# Patient Record
Sex: Female | Born: 1990 | Race: Black or African American | Hispanic: No | Marital: Single | Smoking: Current some day smoker
Health system: Southern US, Community
[De-identification: ages and names within clinical notes are randomized; demographics above are authoritative.]

## PROBLEM LIST (undated history)

## (undated) ENCOUNTER — Inpatient Hospital Stay (HOSPITAL_COMMUNITY): Payer: Self-pay

## (undated) ENCOUNTER — Emergency Department (HOSPITAL_COMMUNITY): Admission: EM | Payer: Medicaid Other | Source: Home / Self Care

## (undated) DIAGNOSIS — O30049 Twin pregnancy, dichorionic/diamniotic, unspecified trimester: Secondary | ICD-10-CM

## (undated) DIAGNOSIS — Z789 Other specified health status: Secondary | ICD-10-CM

## (undated) DIAGNOSIS — Z3480 Encounter for supervision of other normal pregnancy, unspecified trimester: Secondary | ICD-10-CM

## (undated) HISTORY — DX: Twin pregnancy, dichorionic/diamniotic, unspecified trimester: O30.049

## (undated) HISTORY — DX: Encounter for supervision of other normal pregnancy, unspecified trimester: Z34.80

## (undated) HISTORY — PX: INDUCED ABORTION: SHX677

---
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
## 2010-03-21 ENCOUNTER — Inpatient Hospital Stay (HOSPITAL_COMMUNITY)
Admission: AD | Admit: 2010-03-21 | Discharge: 2010-03-21 | Disposition: A | Discharge status: Home / Self Care | Payer: Federal, State, Local not specified - PPO | Source: Ambulatory Visit | Attending: Obstetrics | Admitting: Obstetrics

## 2010-03-21 DIAGNOSIS — N898 Other specified noninflammatory disorders of vagina: Secondary | ICD-10-CM | POA: Insufficient documentation

## 2010-05-09 LAB — CBC
HCT: 29 % — ABNORMAL LOW (ref 36.0–46.0)
HCT: 31.9 % — ABNORMAL LOW (ref 36.0–46.0)
Hemoglobin: 10.5 g/dL — ABNORMAL LOW (ref 12.0–15.0)
MCHC: 32.8 g/dL (ref 30.0–36.0)
Platelets: 358 10*3/uL (ref 150–400)
Platelets: 372 10*3/uL (ref 150–400)
RBC: 3.46 MIL/uL — ABNORMAL LOW (ref 3.87–5.11)
RBC: 3.81 MIL/uL — ABNORMAL LOW (ref 3.87–5.11)
WBC: 20.6 10*3/uL — ABNORMAL HIGH (ref 4.0–10.5)

## 2010-05-09 LAB — CCBB MATERNAL DONOR DRAW

## 2010-05-09 LAB — RPR: RPR Ser Ql: NONREACTIVE

## 2010-05-17 LAB — CBC
HCT: 31 % — ABNORMAL LOW (ref 36.0–46.0)
Hemoglobin: 10.7 g/dL — ABNORMAL LOW (ref 12.0–15.0)
MCHC: 34.4 g/dL (ref 30.0–36.0)
MCV: 90.7 fL (ref 78.0–100.0)
RBC: 3.42 MIL/uL — ABNORMAL LOW (ref 3.87–5.11)
RDW: 13.7 % (ref 11.5–15.5)
WBC: 14.2 10*3/uL — ABNORMAL HIGH (ref 4.0–10.5)

## 2010-05-19 LAB — WET PREP, GENITAL
Clue Cells Wet Prep HPF POC: NONE SEEN
Trich, Wet Prep: NONE SEEN

## 2010-05-19 LAB — GC/CHLAMYDIA PROBE AMP, GENITAL
Chlamydia, DNA Probe: NEGATIVE
GC Probe Amp, Genital: NEGATIVE

## 2010-05-19 LAB — URINE MICROSCOPIC-ADD ON

## 2010-05-19 LAB — URINALYSIS, ROUTINE W REFLEX MICROSCOPIC
Bilirubin Urine: NEGATIVE
Glucose, UA: NEGATIVE mg/dL
Ketones, ur: NEGATIVE mg/dL
Protein, ur: NEGATIVE mg/dL
Urobilinogen, UA: 1 mg/dL (ref 0.0–1.0)

## 2010-05-21 LAB — URINALYSIS, ROUTINE W REFLEX MICROSCOPIC
Ketones, ur: 15 mg/dL — AB
Leukocytes, UA: NEGATIVE
Protein, ur: NEGATIVE mg/dL
Specific Gravity, Urine: >1.03 — ABNORMAL HIGH (ref 1.005–1.030)
Urobilinogen, UA: 1 mg/dL (ref 0.0–1.0)

## 2010-05-21 LAB — WET PREP, GENITAL: Trich, Wet Prep: NONE SEEN

## 2010-05-22 LAB — URINALYSIS, ROUTINE W REFLEX MICROSCOPIC
Bilirubin Urine: NEGATIVE
Glucose, UA: NEGATIVE mg/dL
Glucose, UA: 100 mg/dL — AB
Hgb urine dipstick: NEGATIVE
Ketones, ur: NEGATIVE mg/dL
Nitrite: NEGATIVE
Nitrite: NEGATIVE
Protein, ur: NEGATIVE mg/dL
Specific Gravity, Urine: 1.008 (ref 1.005–1.030)
Urobilinogen, UA: 0.2 mg/dL (ref 0.0–1.0)
pH: 5.5 (ref 5.0–8.0)

## 2010-05-22 LAB — CBC
HCT: 37 % (ref 36.0–46.0)
MCHC: 34.7 g/dL (ref 30.0–36.0)
RBC: 4.07 MIL/uL (ref 3.87–5.11)
WBC: 14 10*3/uL — ABNORMAL HIGH (ref 4.0–10.5)

## 2010-05-22 LAB — GC/CHLAMYDIA PROBE AMP, GENITAL
Chlamydia, DNA Probe: NEGATIVE
GC Probe Amp, Genital: NEGATIVE

## 2010-05-22 LAB — ABO/RH: ABO/RH(D): O POS

## 2010-05-22 LAB — URINE CULTURE

## 2010-05-22 LAB — WET PREP, GENITAL: Yeast Wet Prep HPF POC: NONE SEEN

## 2010-05-22 LAB — HCG, QUANTITATIVE, PREGNANCY: hCG, Beta Chain, Quant, S: 188082 m[IU]/mL — ABNORMAL HIGH (ref <5)

## 2010-05-22 LAB — PREGNANCY, URINE: Preg Test, Ur: POSITIVE

## 2010-05-23 LAB — URINALYSIS, ROUTINE W REFLEX MICROSCOPIC
Glucose, UA: NEGATIVE mg/dL
Ketones, ur: NEGATIVE mg/dL
pH: 6.5 (ref 5.0–8.0)

## 2010-05-23 LAB — WET PREP, GENITAL
Trich, Wet Prep: NONE SEEN
WBC, Wet Prep HPF POC: NONE SEEN
Yeast Wet Prep HPF POC: NONE SEEN

## 2010-05-23 LAB — GC/CHLAMYDIA PROBE AMP, GENITAL
Chlamydia, DNA Probe: NEGATIVE
GC Probe Amp, Genital: NEGATIVE

## 2010-05-23 LAB — URINE CULTURE

## 2010-06-17 ENCOUNTER — Emergency Department (HOSPITAL_COMMUNITY)
Admission: EM | Admit: 2010-06-17 | Discharge: 2010-06-17 | Disposition: A | Discharge status: Home / Self Care | Payer: Federal, State, Local not specified - PPO | Attending: Emergency Medicine | Admitting: Emergency Medicine

## 2010-06-17 ENCOUNTER — Emergency Department (HOSPITAL_COMMUNITY): Payer: Federal, State, Local not specified - PPO

## 2010-06-17 ENCOUNTER — Emergency Department: Admission: EM | Admit: 2010-06-17 | Payer: Federal, State, Local not specified - PPO | Source: Home / Self Care

## 2010-06-17 DIAGNOSIS — S93409A Sprain of unspecified ligament of unspecified ankle, initial encounter: Secondary | ICD-10-CM | POA: Insufficient documentation

## 2010-06-17 DIAGNOSIS — M25579 Pain in unspecified ankle and joints of unspecified foot: Secondary | ICD-10-CM | POA: Insufficient documentation

## 2010-06-17 DIAGNOSIS — X500XXA Overexertion from strenuous movement or load, initial encounter: Secondary | ICD-10-CM | POA: Insufficient documentation

## 2010-06-17 DIAGNOSIS — Y92009 Unspecified place in unspecified non-institutional (private) residence as the place of occurrence of the external cause: Secondary | ICD-10-CM | POA: Insufficient documentation

## 2010-07-09 ENCOUNTER — Emergency Department (HOSPITAL_COMMUNITY)
Admission: EM | Admit: 2010-07-09 | Discharge: 2010-07-10 | Discharge status: Against Medical Advice | Payer: Self-pay | Attending: Emergency Medicine | Admitting: Emergency Medicine

## 2010-07-09 DIAGNOSIS — R1012 Left upper quadrant pain: Secondary | ICD-10-CM | POA: Insufficient documentation

## 2010-07-09 DIAGNOSIS — S31109A Unspecified open wound of abdominal wall, unspecified quadrant without penetration into peritoneal cavity, initial encounter: Secondary | ICD-10-CM | POA: Insufficient documentation

## 2010-07-10 ENCOUNTER — Emergency Department (HOSPITAL_COMMUNITY)
Admission: EM | Admit: 2010-07-10 | Discharge: 2010-07-10 | Disposition: A | Discharge status: Home / Self Care | Payer: Self-pay | Attending: Emergency Medicine | Admitting: Emergency Medicine

## 2010-07-10 DIAGNOSIS — S31109A Unspecified open wound of abdominal wall, unspecified quadrant without penetration into peritoneal cavity, initial encounter: Secondary | ICD-10-CM | POA: Insufficient documentation

## 2010-07-10 DIAGNOSIS — Z23 Encounter for immunization: Secondary | ICD-10-CM | POA: Insufficient documentation

## 2010-11-18 ENCOUNTER — Emergency Department (HOSPITAL_COMMUNITY)
Admission: EM | Admit: 2010-11-18 | Discharge: 2010-11-18 | Disposition: A | Discharge status: Home / Self Care | Payer: Self-pay | Attending: Emergency Medicine | Admitting: Emergency Medicine

## 2010-11-18 DIAGNOSIS — K089 Disorder of teeth and supporting structures, unspecified: Secondary | ICD-10-CM | POA: Insufficient documentation

## 2010-11-18 DIAGNOSIS — R51 Headache: Secondary | ICD-10-CM | POA: Insufficient documentation

## 2010-11-23 ENCOUNTER — Emergency Department (HOSPITAL_COMMUNITY)
Admission: EM | Admit: 2010-11-23 | Discharge: 2010-11-23 | Disposition: A | Discharge status: Home / Self Care | Payer: Self-pay | Attending: Emergency Medicine | Admitting: Emergency Medicine

## 2010-11-23 DIAGNOSIS — R6884 Jaw pain: Secondary | ICD-10-CM | POA: Insufficient documentation

## 2010-11-23 DIAGNOSIS — R070 Pain in throat: Secondary | ICD-10-CM | POA: Insufficient documentation

## 2010-11-23 DIAGNOSIS — M26609 Unspecified temporomandibular joint disorder, unspecified side: Secondary | ICD-10-CM | POA: Insufficient documentation

## 2010-11-23 DIAGNOSIS — R1110 Vomiting, unspecified: Secondary | ICD-10-CM | POA: Insufficient documentation

## 2010-11-23 DIAGNOSIS — R209 Unspecified disturbances of skin sensation: Secondary | ICD-10-CM | POA: Insufficient documentation

## 2010-12-24 ENCOUNTER — Emergency Department (HOSPITAL_COMMUNITY): Payer: Self-pay

## 2010-12-24 ENCOUNTER — Encounter: Payer: Self-pay | Admitting: *Deleted

## 2010-12-24 ENCOUNTER — Emergency Department (HOSPITAL_COMMUNITY)
Admission: EM | Admit: 2010-12-24 | Discharge: 2010-12-24 | Disposition: A | Discharge status: Home / Self Care | Payer: Self-pay | Attending: Emergency Medicine | Admitting: Emergency Medicine

## 2010-12-24 DIAGNOSIS — IMO0002 Reserved for concepts with insufficient information to code with codable children: Secondary | ICD-10-CM | POA: Insufficient documentation

## 2010-12-24 DIAGNOSIS — M25569 Pain in unspecified knee: Secondary | ICD-10-CM | POA: Insufficient documentation

## 2010-12-24 DIAGNOSIS — S80212A Abrasion, left knee, initial encounter: Secondary | ICD-10-CM

## 2010-12-24 DIAGNOSIS — W010XXA Fall on same level from slipping, tripping and stumbling without subsequent striking against object, initial encounter: Secondary | ICD-10-CM | POA: Insufficient documentation

## 2010-12-24 MED ORDER — IBUPROFEN 800 MG PO TABS: 800.0000 mg | ORAL_TABLET | Freq: Three times a day (TID) | ORAL | Status: AC

## 2010-12-24 NOTE — ED Provider Notes (Signed)
History     CSN: [id number] Arrival date & time: 12/24/2010  7:34 AM   First MD Initiated Contact with Patient 12/24/10 0827      Chief Complaint  Patient presents with  . Fall    (Consider location/radiation/quality/duration/timing/severity/associated sxs/prior treatment) Patient is a 20 y.o. female presenting with knee pain.  Knee Pain This is a new problem. The current episode started today. The problem occurs constantly. The problem has been unchanged. Associated symptoms include arthralgias. Pertinent negatives include no joint swelling, myalgias, nausea, numbness, rash or weakness. The symptoms are aggravated by walking and bending. She has tried nothing for the symptoms.   She was leaving work this morning around 4 AM, when she tripped and fell on some concrete. She suffered a abrasion to her left knee. The knee is painful at this time. She is able to flex and extend it, and can bear weight. However, walking makes the pain worse. She has not tried anything for this at home.  History reviewed. No pertinent past medical history.  History reviewed. No pertinent past surgical history.  No family history on file.  History  Substance Use Topics  . Smoking status: Never Smoker   . Smokeless tobacco: Not on file  . Alcohol Use: No    OB History    Grav Para Term Preterm Abortions TAB SAB Ect Mult Living                  Review of Systems  Constitutional: Negative.   Gastrointestinal: Negative for nausea.  Musculoskeletal: Positive for arthralgias. Negative for myalgias, joint swelling and gait problem.  Skin: Positive for wound. Negative for color change and rash.  Neurological: Negative for weakness and numbness.  Hematological: Does not bruise/bleed easily.    Allergies  Review of patient's allergies indicates no known allergies.  Home Medications  No current outpatient prescriptions on file.  BP 114/70  Pulse 83  Temp(Src) 97.6 F (36.4 C) (Oral)  Resp 18   SpO2 100%  LMP 12/04/2010  Physical Exam  Nursing note and vitals reviewed. Constitutional: She is oriented to person, place, and time. She appears well-developed and well-nourished. No distress.  HENT:  Head: Normocephalic and atraumatic.  Right Ear: External ear normal.  Left Ear: External ear normal.  Eyes: Conjunctivae are normal. Pupils are equal, round, and reactive to light.  Neck: Normal range of motion.  Musculoskeletal: Normal range of motion. She exhibits tenderness.       Left knee: There is slight tenderness to palpation over the patella. No edema or erythema noted. Drawer testing and medial and lateral stress testing negative. McMurray's negative. Full range of motion.  Neurological: She is alert and oriented to person, place, and time. She has normal reflexes. No cranial nerve deficit.  Skin: Skin is warm and dry. She is not diaphoretic.       1 cm abrasion noted to the anterior left knee over patella. It is not bleeding.  Psychiatric: She has a normal mood and affect.    ED Course  Procedures (including critical care time)  Labs Reviewed - No data to display Dg Knee Complete 4 Views Left  12/24/2010  *RADIOLOGY REPORT*  Clinical Data: Anterior left knee pain/abrasions  LEFT KNEE - COMPLETE 4+ VIEW  Comparison: None.  Findings:  No fracture or dislocation.  Joint spaces are preserved.  No joint effusion.  No evidence of chondrocalcinosis.  Regional soft tissues are normal.  No radiopaque foreign body.  IMPRESSION:  No  fracture.  No radiopaque foreign body.  Original Report Authenticated By: Waynard Reeds, M.D.     1. Abrasion of left knee       MDM  Plain films reviewed. Knee exam unremarkable for acute pathology. She was instructed to practice RICE for the next few days and use ibuprofen. She verbalized understanding and agreed to plan.        Grant Fontana, [place] 12/24/10 [phone]

## 2010-12-24 NOTE — ED Provider Notes (Signed)
Medical screening examination/treatment/procedure(s) were performed by non-physician practitioner and as supervising physician I was immediately available for consultation/collaboration.  Juliet Rude. Rubin Payor, MD 12/24/10 1649

## 2010-12-24 NOTE — ED Notes (Signed)
Pt is A/O x4. Skin warm and dry. Respirations even and unlabored. NAD noted at this time.   

## 2010-12-24 NOTE — ED Notes (Signed)
Pt states she fell this am around 4 on concrete, has small abrasion/knot on left knee, able to walk on ext

## 2011-07-19 ENCOUNTER — Encounter (HOSPITAL_COMMUNITY): Payer: Self-pay | Admitting: *Deleted

## 2011-07-19 ENCOUNTER — Inpatient Hospital Stay (HOSPITAL_COMMUNITY)
Admission: AD | Admit: 2011-07-19 | Discharge: 2011-07-19 | Disposition: A | Discharge status: Home / Self Care | Payer: Self-pay | Source: Ambulatory Visit | Attending: Obstetrics & Gynecology | Admitting: Obstetrics & Gynecology

## 2011-07-19 DIAGNOSIS — N920 Excessive and frequent menstruation with regular cycle: Secondary | ICD-10-CM

## 2011-07-19 DIAGNOSIS — IMO0002 Reserved for concepts with insufficient information to code with codable children: Secondary | ICD-10-CM | POA: Insufficient documentation

## 2011-07-19 LAB — CBC
MCH: 30.1 pg (ref 26.0–34.0)
MCV: 89.1 fL (ref 78.0–100.0)
Platelets: 342 10*3/uL (ref 150–400)
RDW: 13.8 % (ref 11.5–15.5)
WBC: 13.5 10*3/uL — ABNORMAL HIGH (ref 4.0–10.5)

## 2011-07-19 MED ORDER — DOXYCYCLINE HYCLATE 100 MG PO CAPS: 100.0000 mg | ORAL_CAPSULE | Freq: Two times a day (BID) | ORAL | Status: AC

## 2011-07-19 MED ORDER — METHYLERGONOVINE MALEATE 0.2 MG PO TABS
0.2000 mg | ORAL_TABLET | Freq: Once | ORAL | Status: AC
  Administered 2011-07-19: 0.2 mg via ORAL
  Filled 2011-07-19: qty 1

## 2011-07-19 MED ORDER — KETOROLAC TROMETHAMINE 60 MG/2ML IM SOLN
60.0000 mg | Freq: Once | INTRAMUSCULAR | Status: AC
  Administered 2011-07-19: 60 mg via INTRAMUSCULAR
  Filled 2011-07-19: qty 2

## 2011-07-19 MED ORDER — HYDROCODONE-ACETAMINOPHEN 5-500 MG PO TABS: 1.0000 | ORAL_TABLET | Freq: Four times a day (QID) | ORAL | Status: AC | PRN

## 2011-07-19 MED ORDER — METHYLERGONOVINE MALEATE 0.2 MG PO TABS: 0.2000 mg | ORAL_TABLET | Freq: Four times a day (QID) | ORAL | Status: DC

## 2011-07-19 NOTE — MAU Note (Signed)
Abortion Thursday 5/30. Vaginal bleeding a lot heavier today then immediately after the procedure. Passing large blood clots. Abdominal cramping and back pain 9/10

## 2011-07-19 NOTE — Discharge Instructions (Signed)
Get your prescriptions filled and take as directed. Call your doctor if you continue to have problems.

## 2011-07-19 NOTE — MAU Provider Note (Signed)
History     CSN: [id number]  Arrival date and time: 07/19/11 7829   First Provider Initiated Contact with Patient 07/19/11 [phone]      Chief Complaint  Patient presents with  . Vaginal Bleeding   HPI Kathleen Esparza 21 y.o. Comes to MAU with heavy vaginal bleeding and cramping.  Has not taken any pain medication.  Had TAB on 07-13-11 at clinic on [employer].  Began having heavier bleeding and cramping over the weekend.  It has persisted and she was not able to go to work.  States she used 12 pads at home in the last hour before coming to MAU.  Did not call the contact number for the clinic.  OB History    Grav Para Term Preterm Abortions TAB SAB Ect Mult Living   2 1 1  1 1    1       Past Medical History  Diagnosis Date  . No pertinent past medical history     Past Surgical History  Procedure Date  . Induced abortion     History reviewed. No pertinent family history.  History  Substance Use Topics  . Smoking status: Never Smoker   . Smokeless tobacco: Not on file  . Alcohol Use: No    Allergies: No Known Allergies  No prescriptions prior to admission    Review of Systems  Constitutional: Negative for fever.  Gastrointestinal: Positive for abdominal pain. Negative for nausea and vomiting.  Genitourinary: Negative for dysuria.       Heavy vaginal bleeding   Physical Exam   Blood pressure 110/80, pulse 87, temperature 97 F (36.1 C), temperature source Oral, resp. rate 20, height 5' 3.5" (1.613 m), weight 209 lb (94.802 kg), last menstrual period 05/04/2011.  Physical Exam  Nursing note and vitals reviewed. Constitutional: She is oriented to person, place, and time. She appears well-developed and well-nourished.  HENT:  Head: Normocephalic.  Eyes: EOM are normal.  Neck: Neck supple.  GI: Soft. There is tenderness. There is no rebound and no guarding.  Genitourinary:       Speculum exam: Vulva - negative Vagina - Small amount of dark bloody discharge,  no odor Cervix - No contact bleeding Bimanual exam: Cervix closed Uterus tender, 8 week size Adnexa tender on LLQ, no masses bilaterally GC/Chlam, wet prep done Chaperone present for exam.  Musculoskeletal: Normal range of motion.  Neurological: She is alert and oriented to person, place, and time.  Skin: Skin is warm and dry.  Psychiatric: She has a normal mood and affect.    MAU Course  Procedures  MDM Results for orders placed during the hospital encounter of 07/19/11 (from the past 24 hour(s))  CBC     Status: Abnormal   Collection Time   07/19/11  7:10 AM      Component Value Range   WBC 13.5 (*) 4.0 - 10.5 (K/uL)   RBC 3.76 (*) 3.87 - 5.11 (MIL/uL)   Hemoglobin 11.3 (*) 12.0 - 15.0 (g/dL)   HCT 30.8 (*) 65.7 - 46.0 (%)   MCV 89.1  78.0 - 100.0 (fL)   MCH 30.1  26.0 - 34.0 (pg)   MCHC 33.7  30.0 - 36.0 (g/dL)   RDW 84.6  96.2 - 95.2 (%)   Platelets 342  150 - 400 (K/uL)  WET PREP, GENITAL     Status: Abnormal   Collection Time   07/19/11  7:33 AM      Component Value Range  Yeast Wet Prep HPF POC NONE SEEN  NONE SEEN    Trich, Wet Prep NONE SEEN  NONE SEEN    Clue Cells Wet Prep HPF POC FEW (*) NONE SEEN    WBC, Wet Prep HPF POC NONE SEEN  NONE SEEN    Consult with Dr. Arlyce Dice re: plan of care  Assessment and Plan  Heavy bleeding post TAB  Plan metheringe 0.2 mg po q 6 hours (#10) no refills Vicodin one po q 4-6 hours PRN for pain (#12) No refills Doxycycline 100 mg PO BID x 7 d (#14) no refills Call the clinic if you are having problems.    Ciarra Braddy 07/19/2011, 7:34 AM

## 2011-08-15 ENCOUNTER — Emergency Department (HOSPITAL_COMMUNITY): Payer: No Typology Code available for payment source

## 2011-08-15 ENCOUNTER — Encounter (HOSPITAL_COMMUNITY): Payer: Self-pay | Admitting: Emergency Medicine

## 2011-08-15 ENCOUNTER — Emergency Department (HOSPITAL_COMMUNITY)
Admission: EM | Admit: 2011-08-15 | Discharge: 2011-08-15 | Disposition: A | Discharge status: Home / Self Care | Payer: No Typology Code available for payment source | Attending: Emergency Medicine | Admitting: Emergency Medicine

## 2011-08-15 DIAGNOSIS — M542 Cervicalgia: Secondary | ICD-10-CM | POA: Insufficient documentation

## 2011-08-15 DIAGNOSIS — M25519 Pain in unspecified shoulder: Secondary | ICD-10-CM | POA: Insufficient documentation

## 2011-08-15 DIAGNOSIS — M25559 Pain in unspecified hip: Secondary | ICD-10-CM | POA: Insufficient documentation

## 2011-08-15 MED ORDER — OXYCODONE-ACETAMINOPHEN 5-325 MG PO TABS: 2.0000 | ORAL_TABLET | ORAL | Status: AC | PRN

## 2011-08-15 MED ORDER — MORPHINE SULFATE 4 MG/ML IJ SOLN
4.0000 mg | Freq: Once | INTRAMUSCULAR | Status: AC
  Administered 2011-08-15: 4 mg via INTRAMUSCULAR
  Filled 2011-08-15: qty 1
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
## 2011-08-15 NOTE — ED Notes (Signed)
Pt. Was put into a gown,belonging put in bag
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
## 2011-08-15 NOTE — ED Notes (Signed)
Patient transported to X-ray 
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
## 2011-12-05 ENCOUNTER — Encounter (HOSPITAL_COMMUNITY): Payer: Self-pay | Admitting: *Deleted

## 2011-12-05 ENCOUNTER — Emergency Department (HOSPITAL_COMMUNITY)
Admission: EM | Admit: 2011-12-05 | Discharge: 2011-12-05 | Disposition: A | Discharge status: Home / Self Care | Payer: No Typology Code available for payment source | Attending: Emergency Medicine | Admitting: Emergency Medicine

## 2011-12-05 DIAGNOSIS — Y9389 Activity, other specified: Secondary | ICD-10-CM | POA: Insufficient documentation

## 2011-12-05 DIAGNOSIS — S39012A Strain of muscle, fascia and tendon of lower back, initial encounter: Secondary | ICD-10-CM

## 2011-12-05 DIAGNOSIS — Y92410 Unspecified street and highway as the place of occurrence of the external cause: Secondary | ICD-10-CM | POA: Insufficient documentation

## 2011-12-05 DIAGNOSIS — Z79899 Other long term (current) drug therapy: Secondary | ICD-10-CM | POA: Insufficient documentation

## 2011-12-05 DIAGNOSIS — S161XXA Strain of muscle, fascia and tendon at neck level, initial encounter: Secondary | ICD-10-CM

## 2011-12-05 DIAGNOSIS — S139XXA Sprain of joints and ligaments of unspecified parts of neck, initial encounter: Secondary | ICD-10-CM | POA: Insufficient documentation

## 2011-12-05 DIAGNOSIS — S335XXA Sprain of ligaments of lumbar spine, initial encounter: Secondary | ICD-10-CM | POA: Insufficient documentation

## 2011-12-05 MED ORDER — DIAZEPAM 5 MG PO TABS: 5.0000 mg | ORAL_TABLET | Freq: Four times a day (QID) | ORAL | Status: DC | PRN

## 2011-12-05 MED ORDER — OXYCODONE-ACETAMINOPHEN 5-325 MG PO TABS: 1.0000 | ORAL_TABLET | Freq: Four times a day (QID) | ORAL | Status: DC | PRN

## 2011-12-05 MED ORDER — IBUPROFEN 600 MG PO TABS: 600.0000 mg | ORAL_TABLET | Freq: Four times a day (QID) | ORAL | Status: DC | PRN

## 2011-12-05 MED ORDER — IBUPROFEN 400 MG PO TABS
800.0000 mg | ORAL_TABLET | Freq: Once | ORAL | Status: AC
Start: 2011-12-05 — End: 2011-12-05
  Administered 2011-12-05: 800 mg via ORAL
  Filled 2011-12-05: qty 2
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
## 2012-02-09 ENCOUNTER — Inpatient Hospital Stay (HOSPITAL_COMMUNITY)
Admission: AD | Admit: 2012-02-09 | Discharge: 2012-02-10 | Disposition: A | Discharge status: Home / Self Care | Payer: Medicaid Other | Source: Ambulatory Visit | Attending: Obstetrics & Gynecology | Admitting: Obstetrics & Gynecology

## 2012-02-09 ENCOUNTER — Encounter (HOSPITAL_COMMUNITY): Payer: Self-pay | Admitting: *Deleted

## 2012-02-09 DIAGNOSIS — N760 Acute vaginitis: Secondary | ICD-10-CM | POA: Insufficient documentation

## 2012-02-09 DIAGNOSIS — A499 Bacterial infection, unspecified: Secondary | ICD-10-CM | POA: Insufficient documentation

## 2012-02-09 DIAGNOSIS — O2390 Unspecified genitourinary tract infection in pregnancy, unspecified trimester: Secondary | ICD-10-CM | POA: Insufficient documentation

## 2012-02-09 DIAGNOSIS — B9689 Other specified bacterial agents as the cause of diseases classified elsewhere: Secondary | ICD-10-CM | POA: Insufficient documentation

## 2012-02-09 DIAGNOSIS — O4690 Antepartum hemorrhage, unspecified, unspecified trimester: Secondary | ICD-10-CM

## 2012-02-09 DIAGNOSIS — M545 Low back pain, unspecified: Secondary | ICD-10-CM | POA: Insufficient documentation

## 2012-02-09 DIAGNOSIS — O209 Hemorrhage in early pregnancy, unspecified: Secondary | ICD-10-CM | POA: Insufficient documentation

## 2012-02-09 LAB — POCT PREGNANCY, URINE: Preg Test, Ur: POSITIVE — AB

## 2012-02-09 NOTE — Progress Notes (Signed)
States bleeding stated last night about mn and stopped today about noon

## 2012-02-09 NOTE — MAU Note (Signed)
Earlier today I was bleeding and having some lower and upper back pain. Cramping in back

## 2012-02-10 ENCOUNTER — Inpatient Hospital Stay (HOSPITAL_COMMUNITY): Payer: Medicaid Other

## 2012-02-10 ENCOUNTER — Encounter (HOSPITAL_COMMUNITY): Payer: Self-pay | Admitting: Advanced Practice Midwife

## 2012-02-10 DIAGNOSIS — A499 Bacterial infection, unspecified: Secondary | ICD-10-CM

## 2012-02-10 DIAGNOSIS — N760 Acute vaginitis: Secondary | ICD-10-CM

## 2012-02-10 LAB — URINALYSIS, ROUTINE W REFLEX MICROSCOPIC
Glucose, UA: NEGATIVE mg/dL
Leukocytes, UA: NEGATIVE
Specific Gravity, Urine: >1.03 — ABNORMAL HIGH (ref 1.005–1.030)
pH: 6 (ref 5.0–8.0)

## 2012-02-10 LAB — WET PREP, GENITAL

## 2012-02-10 LAB — URINE MICROSCOPIC-ADD ON

## 2012-02-10 LAB — CBC
HCT: 33.4 % — ABNORMAL LOW (ref 36.0–46.0)
MCV: 87.2 fL (ref 78.0–100.0)
RBC: 3.83 MIL/uL — ABNORMAL LOW (ref 3.87–5.11)
WBC: 11 10*3/uL — ABNORMAL HIGH (ref 4.0–10.5)

## 2012-02-10 MED ORDER — IBUPROFEN 600 MG PO TABS
600.0000 mg | ORAL_TABLET | Freq: Once | ORAL | Status: AC
  Administered 2012-02-10: 600 mg via ORAL
  Filled 2012-02-10: qty 1

## 2012-02-10 MED ORDER — METRONIDAZOLE 500 MG PO TABS: 500.0000 mg | ORAL_TABLET | Freq: Two times a day (BID) | ORAL | Status: DC

## 2012-02-10 MED ORDER — CONCEPT OB 130-92.4-1 MG PO CAPS: 1.0000 | ORAL_CAPSULE | Freq: Every day | ORAL | Status: DC

## 2012-02-10 NOTE — MAU Provider Note (Signed)
Chief Complaint: Back Pain and Vaginal Bleeding  First Provider Initiated Contact with Patient 02/10/12 0045      SUBJECTIVE HPI: Kathleen Esparza is a 21 y.o. G3P1011 at [redacted]w[redacted]d by LMP who presents with bright red bleeding like a period last night and this morning until noon and having some lower and upper back pain. Denies passage of tissue, urinary complaints, GI complaints. No blood work or [place] this pregnancy. Blood type O pos.    Past Medical History  Diagnosis Date  . No pertinent past medical history    OB History    Grav Para Term Preterm Abortions TAB SAB Ect Mult Living   3 1 1  1 1    1      # Outc Date GA Lbr Len/2nd Wgt Sex Del Anes PTL Lv   1 TRM      SVD      2 TAB            3 CUR              Past Surgical History  Procedure Date  . Induced abortion    History   Social History  . Marital Status: Single    Spouse Name: N/A    Number of Children: N/A  . Years of Education: N/A   Occupational History  . Not on file.   Social History Main Topics  . Smoking status: Never Smoker   . Smokeless tobacco: Not on file  . Alcohol Use: No  . Drug Use: No  . Sexually Active: Yes    Birth Control/ Protection: None   Other Topics Concern  . Not on file   Social History Narrative  . No narrative on file   No current facility-administered medications on file prior to encounter.   Current Outpatient Prescriptions on File Prior to Encounter  Medication Sig Dispense Refill  None  ROS: Pertinent items in HPI  OBJECTIVE Blood pressure 117/58, pulse 95, temperature 98 F (36.7 C), temperature source Oral, resp. rate 20, height 5\' 5"  (1.651 m), weight 94.62 kg (208 lb 9.6 oz), last menstrual period 12/04/2011. GENERAL: Well-developed, well-nourished female in no acute distress.  HEENT: Normocephalic HEART: normal rate RESP: normal effort ABDOMEN: Soft, non-tender.  BACK: Mild bilat low back tenderness. No CVAT EXTREMITIES: Nontender, no edema NEURO: Alert and  oriented SPECULUM EXAM: NEFG, moderate amount of tan, malodorous discharge, no active bleeding noted, cervix clean except for one small friable area around os.  BIMANUAL: cervix closed; uterus 12-week size, no adnexal tenderness or masses  LAB RESULTS Results for orders placed during the hospital encounter of 02/09/12 (from the past 24 hour(s))  URINALYSIS, ROUTINE W REFLEX MICROSCOPIC     Status: Abnormal   Collection Time   02/09/12 11:35 PM      Component Value Range   Color, Urine YELLOW  YELLOW   APPearance CLEAR  CLEAR   Specific Gravity, Urine >1.030 (*) 1.005 - 1.030   pH 6.0  5.0 - 8.0   Glucose, UA NEGATIVE  NEGATIVE mg/dL   Hgb urine dipstick MODERATE (*) NEGATIVE   Bilirubin Urine NEGATIVE  NEGATIVE   Ketones, ur NEGATIVE  NEGATIVE mg/dL   Protein, ur NEGATIVE  NEGATIVE mg/dL   Urobilinogen, UA 0.2  0.0 - 1.0 mg/dL   Nitrite NEGATIVE  NEGATIVE   Leukocytes, UA NEGATIVE  NEGATIVE  URINE MICROSCOPIC-ADD ON     Status: Abnormal   Collection Time   02/09/12 11:35 PM  Component Value Range   Squamous Epithelial / LPF RARE  RARE   WBC, UA 0-2  <3 WBC/hpf   RBC / HPF 3-6  <3 RBC/hpf   Bacteria, UA FEW (*) RARE  POCT PREGNANCY, URINE     Status: Abnormal   Collection Time   02/09/12 11:38 PM      Component Value Range   Preg Test, Ur POSITIVE (*) NEGATIVE  CBC     Status: Abnormal   Collection Time   02/10/12 12:19 AM      Component Value Range   WBC 11.0 (*) 4.0 - 10.5 K/uL   RBC 3.83 (*) 3.87 - 5.11 MIL/uL   Hemoglobin 11.6 (*) 12.0 - 15.0 g/dL   HCT 16.1 (*) 09.6 - 04.5 %   MCV 87.2  78.0 - 100.0 fL   MCH 30.3  26.0 - 34.0 pg   MCHC 34.7  30.0 - 36.0 g/dL   RDW 40.9  81.1 - 91.4 %   Platelets 323  150 - 400 K/uL  WET PREP, GENITAL     Status: Abnormal   Collection Time   02/10/12  1:30 AM      Component Value Range   Yeast Wet Prep HPF POC NONE SEEN  NONE SEEN   Trich, Wet Prep NONE SEEN  NONE SEEN   Clue Cells Wet Prep HPF POC FEW (*) NONE SEEN    WBC, Wet Prep HPF POC MODERATE (*) NONE SEEN    IMAGING US Ob Comp Less 14 Wks  02/10/2012  *RADIOLOGY REPORT*  Clinical Data: Cramping and spotting  OBSTETRIC <14 WK ULTRASOUND, TRANSVAGINAL OB US  Technique:  Transabdominal and transvaginal ultrasound was performed for evaluation of the gestation as well as the maternal uterus and adnexal regions.  Number of gestation: 1 Heart Rate: 158 bpm  CRL:  5.1         11w  6d                  [place] EDC: 09/09/2012  Maternal uterus/adnexae: Normal appearance of the ovaries.  No subchorionic hemorrhage.  No free fluid.  IMPRESSION:  1.  Single living intrauterine gestation with an estimated gestational age [redacted] weeks and 6 days.   Original Report Authenticated By: Signa Kell, M.D.    EDD INCORRECT ON [place] REPORT. Should be 08/26/11.   MAU COURSE  ASSESSMENT 1. BV (bacterial vaginosis)   2. Vaginal bleeding in pregnancy   3. Low back pain    PLAN Discharge home Bleeding precautions Pelvic rest Comfort measures for back pain. Mau use Tylenol or Ibuprofen (not after 28 weeks).     Follow-up Information    Follow up with [facility].      Follow up with [facility] [place] [facility]ERN[facility] AD[facility]. (As needed if symptoms worsen)    Contact information:   [address] [id number] mc [place] [address] [phone]          Medication List     As of 02/10/2012  2:18 AM    STOP taking these medications         diazepam 5 MG tablet   Commonly known as: VALIUM      ibuprofen 600 MG tablet   Commonly known as: ADVIL,MOTRIN      oxyCODONE-acetaminophen 5-325 MG per tablet   Commonly known as: PERCOCET/ROXICET      TAKE these medications         CONCEPT OB 130-92.4-1 MG Caps  Take 1 tablet by mouth daily.        [place], CNM 02/10/2012  2:18 AM

## 2012-02-10 NOTE — Progress Notes (Signed)
Written and verbal d/c instructions given and understanding voiced. 

## 2012-02-11 LAB — GC/CHLAMYDIA PROBE AMP: GC Probe RNA: NEGATIVE

## 2012-02-21 NOTE — MAU Provider Note (Signed)
Attestation of Attending Supervision of Advanced Practitioner (CNM/NP): Evaluation and management procedures were performed by the Advanced Practitioner under my supervision and collaboration. I have reviewed the Advanced Practitioner's note and chart, and I agree with the management and plan.  LEGGETT,KELLY H. 10:30 PM

## 2012-04-01 ENCOUNTER — Emergency Department (INDEPENDENT_AMBULATORY_CARE_PROVIDER_SITE_OTHER)
Admission: EM | Admit: 2012-04-01 | Discharge: 2012-04-01 | Disposition: A | Discharge status: Home / Self Care | Payer: Medicaid Other | Source: Home / Self Care | Attending: Emergency Medicine | Admitting: Emergency Medicine

## 2012-04-01 ENCOUNTER — Encounter (HOSPITAL_COMMUNITY): Payer: Self-pay | Admitting: Emergency Medicine

## 2012-04-01 DIAGNOSIS — J36 Peritonsillar abscess: Secondary | ICD-10-CM

## 2012-04-01 LAB — POCT RAPID STREP A: Streptococcus, Group A Screen (Direct): NEGATIVE

## 2012-04-01 MED ORDER — HYDROMORPHONE HCL PF 1 MG/ML IJ SOLN
INTRAMUSCULAR | Status: AC
  Filled 2012-04-01: qty 1

## 2012-04-01 MED ORDER — ONDANSETRON HCL 4 MG/2ML IJ SOLN
INTRAMUSCULAR | Status: AC
  Filled 2012-04-01: qty 2

## 2012-04-01 MED ORDER — ONDANSETRON HCL 4 MG/2ML IJ SOLN
4.0000 mg | Freq: Once | INTRAMUSCULAR | Status: AC
  Administered 2012-04-01: 4 mg via INTRAMUSCULAR

## 2012-04-01 MED ORDER — HYDROMORPHONE HCL PF 1 MG/ML IJ SOLN
1.0000 mg | Freq: Once | INTRAMUSCULAR | Status: AC
  Administered 2012-04-01: 1 mg via INTRAMUSCULAR

## 2012-04-01 NOTE — ED Notes (Signed)
Pt c/o sore throat since last Friday Sx include: dysphagia, hurts to talk, v/n/f, SOB Denies: diarrhea Has tried gargling warm salt water  She is alert w/no signs of acute distress.

## 2012-04-01 NOTE — ED Provider Notes (Signed)
Chief Complaint  Patient presents with  . Sore Throat    History of Present Illness:   CERINA LEARY is a 22 year old female who has had a four-day history of severe sore throat on the left side and pain on swallowing, so much so that she's been unable to swallow food, liquid, or saliva and has had virtually no by mouth intake over the past 3-4 days. She's also had fever to palpation, nasal congestion, rhinorrhea, cough, and vomiting she has no medication allergies, takes no prescription medications, has no other medical problems, and is not pregnant or breast-feeding.  Review of Systems:  Other than as noted above, the patient denies any of the following symptoms. Systemic:  No fever, chills, sweats, fatigue, myalgias, headache, or anorexia. Eye:  No redness, pain or drainage. ENT:  No earache, ear congestion, nasal congestion, sneezing, rhinorrhea, sinus pressure, sinus pain, or post nasal drip. Lungs:  No cough, sputum production, wheezing, shortness of breath, or chest pain. GI:  No abdominal pain, nausea, vomiting, or diarrhea. Skin:  No rash or itching.  PMFSH:  Past medical history, family history, social history, meds, allergies, and nurse's notes were reviewed.  There is no known exposure to strep or mono.  No prior history of step or mono.  The patient denies use of tobacco.  Physical Exam:   Vital signs:  BP 139/93  Pulse 118  Temp(Src) 99.9 F (37.7 C) (Oral)  Resp 18  SpO2 100%  LMP 12/04/2011 General:  Alert, appears in considerable distress from or sore throat. She's holding a emesis bag in front of her and not able to swallow any saliva. She's unable to open her mouth more than a couple of centimeters. My view of the throat was very limited, but the left anterior tonsillar pillar was markedly swollen and erythematous. I did not see any exudate. Eye:  No conjunctival injection or drainage. Lids were normal. ENT:  TMs and canals were normal, without erythema or  inflammation.  Nasal mucosa was clear and uncongested, without drainage.  Mucous membranes were moist.  Exam of pharynx left anterior tonsillar pillar was swollen and erythematous, no exudate.  There were no oral ulcerations or lesions. Neck:  Supple, no adenopathy, tenderness or mass. Lungs:  No respiratory distress.  Lungs were clear to auscultation, without wheezes, rales or rhonchi.  Breath sounds were clear and equal bilaterally.  Heart:  Regular rhythm, without gallops, murmers or rubs. Skin:  Clear, warm, and dry, without rash or lesions.  Labs:   Results for orders placed during the hospital encounter of 04/01/12  POCT RAPID STREP A ([facility] ONLY)      Result Value Range   Streptococcus, Group A Screen (Direct) NEGATIVE  NEGATIVE   Course in Urgent Care Center:   Given Dilaudid 1 mg IM and Zofran 4 mg IM.  Assessment:  The encounter diagnosis was Peritonsillar abscess.  Plan:   1.  The following meds were prescribed:   New Prescriptions   No medications on file   2.  The patient was instructed to go directly to Dr. Roddie Mc office, across the Street, they're expecting her. I did call over there to give report.   Reuben Likes, MD 04/01/12 [phone]

## 2012-11-07 ENCOUNTER — Encounter (HOSPITAL_COMMUNITY): Payer: Self-pay | Admitting: Emergency Medicine

## 2012-11-07 ENCOUNTER — Emergency Department (HOSPITAL_COMMUNITY)
Admission: EM | Admit: 2012-11-07 | Discharge: 2012-11-07 | Disposition: A | Discharge status: Home / Self Care | Payer: Medicaid Other | Attending: Emergency Medicine | Admitting: Emergency Medicine

## 2012-11-07 DIAGNOSIS — IMO0002 Reserved for concepts with insufficient information to code with codable children: Secondary | ICD-10-CM | POA: Insufficient documentation

## 2012-11-07 DIAGNOSIS — Z3202 Encounter for pregnancy test, result negative: Secondary | ICD-10-CM | POA: Insufficient documentation

## 2012-11-07 DIAGNOSIS — S0990XA Unspecified injury of head, initial encounter: Secondary | ICD-10-CM | POA: Insufficient documentation

## 2012-11-07 DIAGNOSIS — Y939 Activity, unspecified: Secondary | ICD-10-CM | POA: Insufficient documentation

## 2012-11-07 DIAGNOSIS — Y92410 Unspecified street and highway as the place of occurrence of the external cause: Secondary | ICD-10-CM | POA: Insufficient documentation

## 2012-11-07 MED ORDER — IBUPROFEN 200 MG PO TABS
600.0000 mg | ORAL_TABLET | Freq: Once | ORAL | Status: AC
  Administered 2012-11-07: 600 mg via ORAL
  Filled 2012-11-07: qty 3

## 2012-11-07 MED ORDER — IBUPROFEN 600 MG PO TABS: 600.0000 mg | ORAL_TABLET | Freq: Four times a day (QID) | ORAL | Status: DC | PRN

## 2012-11-07 MED ORDER — CYCLOBENZAPRINE HCL 5 MG PO TABS: 5.0000 mg | ORAL_TABLET | Freq: Three times a day (TID) | ORAL | Status: DC | PRN
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
## 2012-11-07 NOTE — ED Provider Notes (Signed)
Medical screening examination/treatment/procedure(s) were performed by non-physician practitioner and as supervising physician I was immediately available for consultation/collaboration.  Geoffery Lyons, MD 11/07/12 [phone]
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
## 2013-01-12 ENCOUNTER — Encounter (HOSPITAL_COMMUNITY): Payer: Self-pay | Admitting: *Deleted

## 2013-01-12 ENCOUNTER — Inpatient Hospital Stay (HOSPITAL_COMMUNITY): Payer: Medicaid Other

## 2013-01-12 ENCOUNTER — Inpatient Hospital Stay (HOSPITAL_COMMUNITY)
Admission: AD | Admit: 2013-01-12 | Discharge: 2013-01-12 | Disposition: A | Discharge status: Home / Self Care | Payer: Medicaid Other | Source: Ambulatory Visit | Attending: Obstetrics and Gynecology | Admitting: Obstetrics and Gynecology

## 2013-01-12 DIAGNOSIS — O26899 Other specified pregnancy related conditions, unspecified trimester: Secondary | ICD-10-CM

## 2013-01-12 DIAGNOSIS — O2390 Unspecified genitourinary tract infection in pregnancy, unspecified trimester: Secondary | ICD-10-CM | POA: Insufficient documentation

## 2013-01-12 DIAGNOSIS — B9689 Other specified bacterial agents as the cause of diseases classified elsewhere: Secondary | ICD-10-CM | POA: Insufficient documentation

## 2013-01-12 DIAGNOSIS — R110 Nausea: Secondary | ICD-10-CM

## 2013-01-12 DIAGNOSIS — A499 Bacterial infection, unspecified: Secondary | ICD-10-CM | POA: Insufficient documentation

## 2013-01-12 DIAGNOSIS — R109 Unspecified abdominal pain: Secondary | ICD-10-CM | POA: Insufficient documentation

## 2013-01-12 DIAGNOSIS — N760 Acute vaginitis: Secondary | ICD-10-CM | POA: Insufficient documentation

## 2013-01-12 DIAGNOSIS — N949 Unspecified condition associated with female genital organs and menstrual cycle: Secondary | ICD-10-CM | POA: Insufficient documentation

## 2013-01-12 HISTORY — DX: Other specified health status: Z78.9

## 2013-01-12 LAB — WET PREP, GENITAL: Yeast Wet Prep HPF POC: NONE SEEN

## 2013-01-12 LAB — CBC WITH DIFFERENTIAL/PLATELET
Eosinophils Absolute: 0.1 10*3/uL (ref 0.0–0.7)
Eosinophils Relative: 1 % (ref 0–5)
Lymphs Abs: 3.3 10*3/uL (ref 0.7–4.0)
MCH: 30.8 pg (ref 26.0–34.0)
MCV: 87.4 fL (ref 78.0–100.0)
Monocytes Absolute: 0.8 10*3/uL (ref 0.1–1.0)
Platelets: 352 10*3/uL (ref 150–400)
RBC: 4.12 MIL/uL (ref 3.87–5.11)

## 2013-01-12 LAB — URINE MICROSCOPIC-ADD ON

## 2013-01-12 LAB — URINALYSIS, ROUTINE W REFLEX MICROSCOPIC
Bilirubin Urine: NEGATIVE
Protein, ur: NEGATIVE mg/dL
Urobilinogen, UA: 0.2 mg/dL (ref 0.0–1.0)

## 2013-01-12 LAB — HCG, QUANTITATIVE, PREGNANCY: hCG, Beta Chain, Quant, S: 1559 m[IU]/mL — ABNORMAL HIGH (ref <5)

## 2013-01-12 LAB — ABO/RH: ABO/RH(D): O POS

## 2013-01-12 MED ORDER — ACETAMINOPHEN 325 MG PO TABS
650.0000 mg | ORAL_TABLET | Freq: Once | ORAL | Status: AC
  Administered 2013-01-12: 650 mg via ORAL
  Filled 2013-01-12: qty 2

## 2013-01-12 MED ORDER — PROMETHAZINE HCL 25 MG PO TABS: 25.0000 mg | ORAL_TABLET | Freq: Four times a day (QID) | ORAL | Status: DC | PRN

## 2013-01-12 MED ORDER — METRONIDAZOLE 500 MG PO TABS: 500.0000 mg | ORAL_TABLET | Freq: Two times a day (BID) | ORAL | Status: DC

## 2013-01-12 NOTE — MAU Provider Note (Signed)
History     CSN: [id number]  Arrival date and time: 01/12/13 1628   First Provider Initiated Contact with Patient 01/12/13 1708      Chief Complaint  Patient presents with  . Abdominal Pain   HPI Comments: Kathleen Esparza 22 y.o. [id number] presents to MAU with pelvic pains in early pregnancy. She was unaware she was pregnant. Her pain started yesterday and is 5/5 on 1-10 scale.    Abdominal Pain Pertinent negatives include no dysuria.      Past Medical History  Diagnosis Date  . No pertinent past medical history   . Medical history non-contributory     Past Surgical History  Procedure Laterality Date  . Induced abortion      Family History  Problem Relation Age of Onset  . Other Neg Hx     History  Substance Use Topics  . Smoking status: Never Smoker   . Smokeless tobacco: Never Used  . Alcohol Use: No    Allergies: No Known Allergies  No prescriptions prior to admission    Review of Systems  Constitutional: Negative.   HENT: Negative.   Eyes: Negative.   Respiratory: Negative.   Cardiovascular: Negative.   Gastrointestinal: Positive for abdominal pain.  Genitourinary: Negative for dysuria.  Musculoskeletal: Negative.   Skin: Negative.   Neurological: Negative.   Psychiatric/Behavioral: Negative.    Physical Exam   Blood pressure 142/68, pulse 92, temperature 98.3 F (36.8 C), temperature source Oral, resp. rate 16, height 5\' 4"  (1.626 m), weight 80.4 kg (177 lb 4 oz), last menstrual period 12/02/2012, not currently breastfeeding.  Physical Exam  Constitutional: She appears well-developed and well-nourished. No distress.  HENT:  Head: Atraumatic.  Eyes: Pupils are equal, round, and reactive to light.  GI: Soft. There is tenderness.  Genitourinary: Vagina normal. No vaginal discharge found.  Musculoskeletal: Normal range of motion.  Neurological: She is alert.  Skin: Skin is warm.  Psychiatric: She has a normal mood and affect. Her behavior  is normal. Thought content normal.   Results for orders placed during the hospital encounter of 01/12/13 (from the past 24 hour(s))  URINALYSIS, ROUTINE W REFLEX MICROSCOPIC     Status: Abnormal   Collection Time    01/12/13  4:40 PM      Result Value Range   Color, Urine YELLOW  YELLOW   APPearance CLEAR  CLEAR   Specific Gravity, Urine 1.025  1.005 - 1.030   pH 6.5  5.0 - 8.0   Glucose, UA NEGATIVE  NEGATIVE mg/dL   Hgb urine dipstick TRACE (*) NEGATIVE   Bilirubin Urine NEGATIVE  NEGATIVE   Ketones, ur NEGATIVE  NEGATIVE mg/dL   Protein, ur NEGATIVE  NEGATIVE mg/dL   Urobilinogen, UA 0.2  0.0 - 1.0 mg/dL   Nitrite NEGATIVE  NEGATIVE   Leukocytes, UA NEGATIVE  NEGATIVE  URINE MICROSCOPIC-ADD ON     Status: Abnormal   Collection Time    01/12/13  4:40 PM      Result Value Range   Squamous Epithelial / LPF FEW (*) RARE   WBC, UA 0-2  <3 WBC/hpf   RBC / HPF 0-2  <3 RBC/hpf   Bacteria, UA FEW (*) RARE  POCT PREGNANCY, URINE     Status: Abnormal   Collection Time    01/12/13  4:52 PM      Result Value Range   Preg Test, Ur POSITIVE (*) NEGATIVE  WET PREP, GENITAL     Status: Abnormal  Collection Time    01/12/13  5:23 PM      Result Value Range   Yeast Wet Prep HPF POC NONE SEEN  NONE SEEN   Trich, Wet Prep NONE SEEN  NONE SEEN   Clue Cells Wet Prep HPF POC MODERATE (*) NONE SEEN   WBC, Wet Prep HPF POC FEW (*) NONE SEEN  CBC WITH DIFFERENTIAL     Status: None   Collection Time    01/12/13  5:50 PM      Result Value Range   WBC 10.5  4.0 - 10.5 K/uL   RBC 4.12  3.87 - 5.11 MIL/uL   Hemoglobin 12.7  12.0 - 15.0 g/dL   HCT 13.0  86.5 - 78.4 %   MCV 87.4  78.0 - 100.0 fL   MCH 30.8  26.0 - 34.0 pg   MCHC 35.3  30.0 - 36.0 g/dL   RDW 69.6  29.5 - 28.4 %   Platelets 352  150 - 400 K/uL   Neutrophils Relative % 60  43 - 77 %   Neutro Abs 6.3  1.7 - 7.7 K/uL   Lymphocytes Relative 31  12 - 46 %   Lymphs Abs 3.3  0.7 - 4.0 K/uL   Monocytes Relative 7  3 - 12 %    Monocytes Absolute 0.8  0.1 - 1.0 K/uL   Eosinophils Relative 1  0 - 5 %   Eosinophils Absolute 0.1  0.0 - 0.7 K/uL   Basophils Relative 0  0 - 1 %   Basophils Absolute 0.0  0.0 - 0.1 K/uL  HCG, QUANTITATIVE, PREGNANCY     Status: Abnormal   Collection Time    01/12/13  5:50 PM      Result Value Range   hCG, Beta Chain, Quant, S 1559 (*) <5 mIU/mL  ABO/RH     Status: None   Collection Time    01/12/13  5:50 PM      Result Value Range   ABO/RH(D) O POS     US Ob Comp Less 14 Wks  01/12/2013   CLINICAL DATA:  Pain. Estimated gestational age by LMP is 5 weeks 6 days  EXAM: OBSTETRIC <14 WK ULTRASOUND  TECHNIQUE: Transabdominal and transvaginal ultrasound was performed for evaluation of the gestation as well as the maternal uterus and adnexal regions.  COMPARISON:  None.  FINDINGS: Intrauterine gestational sac: Probable early intrauterine gestational sac.  Yolk sac:  None visualized  Embryo:  None visualized  MSD: 3.6  mm   4 w   6  d  Maternal uterus/adnexae: No subchorionic hemorrhage is seen. Both ovaries are within normal limits. There is a trace amount of free fluid in the cul-de-sac. Marland Kitchen  IMPRESSION: Probable early intrauterine gestational sac, but no yolk sac, fetal pole, or cardiac activity yet visualized. Recommend follow-up quantitative B-HCG levels and follow-up US in 14 days to confirm and assess viability. This recommendation follows SRU consensus guidelines: Diagnostic Criteria for Nonviable Pregnancy Early in the First Trimester. Malva Limes Med 2013; 132:4401-02.   Electronically Signed   By: Britta Mccreedy M.D.   On: 01/12/2013 19:20   US Ob Transvaginal  01/12/2013   CLINICAL DATA:  Pain. Estimated gestational age by LMP is 5 weeks 6 days  EXAM: OBSTETRIC <14 WK ULTRASOUND  TECHNIQUE: Transabdominal and transvaginal ultrasound was performed for evaluation of the gestation as well as the maternal uterus and adnexal regions.  COMPARISON:  None.  FINDINGS: Intrauterine gestational sac:  Probable early intrauterine gestational sac.  Yolk sac:  None visualized  Embryo:  None visualized  MSD: 3.6  mm   4 w   6  d  Maternal uterus/adnexae: No subchorionic hemorrhage is seen. Both ovaries are within normal limits. There is a trace amount of free fluid in the cul-de-sac. Marland Kitchen  IMPRESSION: Probable early intrauterine gestational sac, but no yolk sac, fetal pole, or cardiac activity yet visualized. Recommend follow-up quantitative B-HCG levels and follow-up US in 14 days to confirm and assess viability. This recommendation follows SRU consensus guidelines: Diagnostic Criteria for Nonviable Pregnancy Early in the First Trimester. Malva Limes Med 2013; 161:0960-45.   Electronically Signed   By: Britta Mccreedy M.D.   On: 01/12/2013 19:20     MAU Course  Procedures  MDM  Tylenol for pain CBC, Quant, ABORh, U/S Wet prep/ GC/Chlamydia  Assessment and Plan   A: Abdominal pains in early pregnancy Bacterial Vaginosis  P: Above labs and tests done Will repeat quant in 48 hours Phenergan 25 mg po q 6 hours prn nausea Flagyl 500 mg BID X7 days Plans to establish care with Dr Lilly Cove, Rubbie Battiest 01/12/2013, 7:39 PM

## 2013-01-12 NOTE — MAU Note (Signed)
Patient presents with lower abdominal pain since yesterday.

## 2013-01-12 NOTE — MAU Provider Note (Signed)
Attestation of Attending Supervision of Advanced Practitioner: Evaluation and management procedures were performed by the PA/NP/CNM/OB Fellow under my supervision/collaboration. Chart reviewed and agree with management and plan.  Lorella Gomez V 01/12/2013 9:53 PM   

## 2013-02-03 ENCOUNTER — Ambulatory Visit (INDEPENDENT_AMBULATORY_CARE_PROVIDER_SITE_OTHER): Payer: Medicaid Other | Admitting: Advanced Practice Midwife

## 2013-02-03 ENCOUNTER — Encounter: Payer: Self-pay | Admitting: Advanced Practice Midwife

## 2013-02-03 VITALS — BP 126/76 | Temp 98.2°F | Wt 177.0 lb

## 2013-02-03 DIAGNOSIS — O219 Vomiting of pregnancy, unspecified: Secondary | ICD-10-CM

## 2013-02-03 DIAGNOSIS — Z3201 Encounter for pregnancy test, result positive: Secondary | ICD-10-CM

## 2013-02-03 DIAGNOSIS — Z3480 Encounter for supervision of other normal pregnancy, unspecified trimester: Secondary | ICD-10-CM

## 2013-02-03 DIAGNOSIS — Z3481 Encounter for supervision of other normal pregnancy, first trimester: Secondary | ICD-10-CM

## 2013-02-03 LAB — POCT URINALYSIS DIPSTICK
Blood, UA: NEGATIVE
Ketones, UA: NEGATIVE
Nitrite, UA: NEGATIVE
Spec Grav, UA: 1.01
Urobilinogen, UA: NEGATIVE
pH, UA: 8

## 2013-02-03 MED ORDER — DOXYLAMINE-PYRIDOXINE 10-10 MG PO TBEC: 2.0000 | DELAYED_RELEASE_TABLET | Freq: Every day | ORAL | Status: DC

## 2013-02-03 NOTE — Progress Notes (Signed)
   Subjective:    Kathleen Esparza is a G4P1011 [redacted]w[redacted]d being seen today for her first obstetrical visit.  Her obstetrical history is not significant for complications. Patient uncertain intend to breast feed. Pregnancy history fully reviewed.  Patient reports nausea and vomiting.  Filed Vitals:   02/03/13 0924  BP: 126/76  Temp: 98.2 F (36.8 C)  Weight: 177 lb (80.287 kg)    HISTORY: OB History  Gravida Para Term Preterm AB SAB TAB Ectopic Multiple Living  4 1 1  1  1   1     # Outcome Date GA Lbr Len/2nd Weight Sex Delivery Anes PTL Lv  4 CUR           3 TAB 01/2012          2 TRM 04/20/09    F SVD EPI  Y  1 GRA              Comments: System Generated. Please review and update pregnancy details.     Past Medical History  Diagnosis Date  . No pertinent past medical history   . Medical history non-contributory    Past Surgical History  Procedure Laterality Date  . Induced abortion     Family History  Problem Relation Age of Onset  . Other Neg Hx   . COPD Maternal Grandmother   . Diabetes Maternal Grandmother      Exam    Uterus:     Pelvic Exam:    Perineum: No Hemorrhoids   Vulva: normal   Vagina:  normal mucosa   pH: NA   Cervix: no cervical motion tenderness and no lesions   Adnexa: normal adnexa   Bony Pelvis: average  System: Breast:  normal appearance, no masses or tenderness   Skin: normal coloration and turgor, no rashes    Neurologic: oriented, normal   Extremities: normal strength, tone, and muscle mass   HEENT PERRLA   Mouth/Teeth mucous membranes moist, pharynx normal without lesions   Neck supple and no masses   Cardiovascular: regular rate and rhythm, no murmurs or gallops   Respiratory:  appears well, vitals normal, no respiratory distress, acyanotic, normal RR, ear and throat exam is normal, neck free of mass or lymphadenopathy, chest clear, no wheezing, crepitations, rhonchi, normal symmetric air entry   Abdomen: soft, non-tender; bowel  sounds normal; no masses,  no organomegaly   Urinary: urethral meatus normal      Assessment:    Pregnancy: G4P1011 There are no active problems to display for this patient. Nausea and Vomiting in pregnancy Uncertain LMP      Plan:     Initial labs drawn. Prenatal vitamins. Problem list reviewed and updated. Genetic Screening discussed Quad Screen: plan after 14 weeks.  Ultrasound discussed; fetal survey: requested., verify EDD. Diclegis to start in evenings, increase PRN.  Phenergan Rx Q6 hours, PRN. Patient to start  Follow up in 4 weeks. 80% of 40 min visit spent on counseling and coordination of care.     Kathleen Esparza 02/03/2013

## 2013-02-03 NOTE — Progress Notes (Signed)
Pulse 76 Pt states pregnancy is going well. No concerns today.

## 2013-02-04 ENCOUNTER — Ambulatory Visit (INDEPENDENT_AMBULATORY_CARE_PROVIDER_SITE_OTHER): Payer: Medicaid Other

## 2013-02-04 ENCOUNTER — Other Ambulatory Visit: Payer: Self-pay | Admitting: Obstetrics & Gynecology

## 2013-02-04 ENCOUNTER — Encounter: Payer: Self-pay | Admitting: Advanced Practice Midwife

## 2013-02-04 DIAGNOSIS — O365990 Maternal care for other known or suspected poor fetal growth, unspecified trimester, not applicable or unspecified: Secondary | ICD-10-CM

## 2013-02-04 DIAGNOSIS — O3680X1 Pregnancy with inconclusive fetal viability, fetus 1: Secondary | ICD-10-CM

## 2013-02-04 DIAGNOSIS — O3680X0 Pregnancy with inconclusive fetal viability, not applicable or unspecified: Secondary | ICD-10-CM

## 2013-02-04 LAB — OBSTETRIC PANEL
Basophils Absolute: 0 10*3/uL (ref 0.0–0.1)
Basophils Relative: 0 % (ref 0–1)
Hemoglobin: 13 g/dL (ref 12.0–15.0)
Hepatitis B Surface Ag: NEGATIVE
Lymphs Abs: 2.3 10*3/uL (ref 0.7–4.0)
MCHC: 34.3 g/dL (ref 30.0–36.0)
Monocytes Relative: 5 % (ref 3–12)
Neutro Abs: 8.1 10*3/uL — ABNORMAL HIGH (ref 1.7–7.7)
Neutrophils Relative %: 74 % (ref 43–77)
RBC: 4.21 MIL/uL (ref 3.87–5.11)
RDW: 13.8 % (ref 11.5–15.5)
Rh Type: POSITIVE

## 2013-02-04 LAB — PAP IG W/ RFLX HPV ASCU

## 2013-02-04 LAB — US OB COMP LESS 14 WKS

## 2013-02-04 LAB — CULTURE, OB URINE
Colony Count: NO GROWTH
Organism ID, Bacteria: NO GROWTH

## 2013-02-04 LAB — HIV ANTIBODY (ROUTINE TESTING W REFLEX): HIV: NONREACTIVE

## 2013-02-04 LAB — VARICELLA ZOSTER ANTIBODY, IGG: Varicella IgG: >4000 {index} — ABNORMAL HIGH

## 2013-02-04 LAB — VITAMIN D 25 HYDROXY (VIT D DEFICIENCY, FRACTURES): Vit D, 25-Hydroxy: 13 ng/mL — ABNORMAL LOW (ref 30–89)

## 2013-02-05 LAB — HEMOGLOBINOPATHY EVALUATION
Hemoglobin Other: 0 %
Hgb A2 Quant: 2.5 % (ref 2.2–3.2)
Hgb F Quant: 0 % (ref 0.0–2.0)
Hgb S Quant: 0 %

## 2013-02-10 ENCOUNTER — Encounter: Payer: Self-pay | Admitting: Obstetrics & Gynecology

## 2013-02-10 LAB — US OB DETAIL + 14 WK

## 2013-02-11 ENCOUNTER — Other Ambulatory Visit: Payer: Self-pay | Admitting: Advanced Practice Midwife

## 2013-02-11 DIAGNOSIS — Z3480 Encounter for supervision of other normal pregnancy, unspecified trimester: Secondary | ICD-10-CM

## 2013-02-11 MED ORDER — OB COMPLETE PETITE 35-5-1-200 MG PO CAPS: 1.0000 | ORAL_CAPSULE | Freq: Every day | ORAL | Status: DC

## 2013-02-16 ENCOUNTER — Encounter (HOSPITAL_COMMUNITY): Payer: Self-pay | Admitting: Emergency Medicine

## 2013-02-16 ENCOUNTER — Emergency Department (HOSPITAL_COMMUNITY)
Admission: EM | Admit: 2013-02-16 | Discharge: 2013-02-16 | Disposition: A | Discharge status: Home / Self Care | Payer: Medicaid Other | Attending: Emergency Medicine | Admitting: Emergency Medicine

## 2013-02-16 DIAGNOSIS — J36 Peritonsillar abscess: Secondary | ICD-10-CM | POA: Insufficient documentation

## 2013-02-16 DIAGNOSIS — H9209 Otalgia, unspecified ear: Secondary | ICD-10-CM | POA: Insufficient documentation

## 2013-02-16 DIAGNOSIS — Z3490 Encounter for supervision of normal pregnancy, unspecified, unspecified trimester: Secondary | ICD-10-CM

## 2013-02-16 DIAGNOSIS — Z79899 Other long term (current) drug therapy: Secondary | ICD-10-CM | POA: Insufficient documentation

## 2013-02-16 DIAGNOSIS — O9989 Other specified diseases and conditions complicating pregnancy, childbirth and the puerperium: Secondary | ICD-10-CM | POA: Insufficient documentation

## 2013-02-16 LAB — RAPID STREP SCREEN (MED CTR MEBANE ONLY): Streptococcus, Group A Screen (Direct): POSITIVE — AB

## 2013-02-16 MED ORDER — ACETAMINOPHEN 325 MG PO TABS
650.0000 mg | ORAL_TABLET | Freq: Once | ORAL | Status: AC
  Administered 2013-02-16: 650 mg via ORAL
  Filled 2013-02-16: qty 2

## 2013-02-16 MED ORDER — CLINDAMYCIN HCL 150 MG PO CAPS: 300.0000 mg | ORAL_CAPSULE | Freq: Three times a day (TID) | ORAL | Status: DC

## 2013-02-16 NOTE — ED Notes (Signed)
States, "Feels like I have another abscess in my throat again" Recently seen for same

## 2013-02-16 NOTE — Discharge Instructions (Signed)
Call Dr. Ellyn HackGore's office tomorrow for treatment. Take Tylenol for pain. Salt water gargles as needed. Return if Symptoms worsen.   Take medication as prescribed.

## 2013-02-16 NOTE — ED Provider Notes (Signed)
CSN: [id number]     Arrival date & time 02/16/13  0827 History   First MD Initiated Contact with Patient 02/16/13 [phone]     Chief Complaint  Patient presents with  . Sore Throat   (Consider location/radiation/quality/duration/timing/severity/associated sxs/prior Treatment) HPI Comments: Kathleen Esparza is a 23 y.o. year-old female with a past medical history of pregnancy  11weeks peritonsillar abscess, presenting the Emergency Department with a chief complaint of sore throat.  She reports similar to pain as previous abscess.  Reports increase pain with swallowing.  Denies fever, or chill, drooling, or inability to eat. Denies abdominal pain, nausea, or diarrhea.  ENT:Dr. Melvenia BeamMitchell Gore  The history is provided by the patient and medical records. No language interpreter was used.    Past Medical History  Diagnosis Date  . No pertinent past medical history   . Medical history non-contributory    Past Surgical History  Procedure Laterality Date  . Induced abortion     Family History  Problem Relation Age of Onset  . Other Neg Hx   . COPD Maternal Grandmother   . Diabetes Maternal Grandmother    History  Substance Use Topics  . Smoking status: Never Smoker   . Smokeless tobacco: Never Used  . Alcohol Use: No   OB History   Grav Para Term Preterm Abortions TAB SAB Ect Mult Living   4 1 1  2 2    1      Review of Systems  Constitutional: Negative for fever and chills.  HENT: Positive for ear pain, sore throat and trouble swallowing. Negative for drooling, mouth sores and voice change.     Allergies  Review of patient's allergies indicates no known allergies.  Home Medications   Current Outpatient Rx  Name  Route  Sig  Dispense  Refill  . Prenat-FeCbn-FeAspGl-FA-Omega (OB COMPLETE PETITE) 35-5-1-200 MG CAPS   Oral   Take 1 tablet by mouth daily.   30 capsule   12   . promethazine (PHENERGAN) 25 MG tablet   Oral   Take 1 tablet (25 mg total) by mouth every 6 (six)  hours as needed for nausea or vomiting.   30 tablet   0    BP 118/77  Pulse 86  Temp(Src) 98.5 F (36.9 C) (Oral)  Resp 16  Wt 177 lb (80.287 kg)  SpO2 98%  LMP 12/02/2012 Physical Exam  Nursing note and vitals reviewed. Constitutional: She appears well-developed and well-nourished.  HENT:  Head: Normocephalic and atraumatic.  Right Ear: Tympanic membrane and external ear normal.  Left Ear: Tympanic membrane and external ear normal.  Mouth/Throat: Uvula is midline. Mucous membranes are not dry. No trismus in the jaw. No uvula swelling. Oropharyngeal exudate, posterior oropharyngeal edema, posterior oropharyngeal erythema and tonsillar abscesses present.  Left tonsillar swelling with overlying erythema.  Bulge of left arch. Uvula without deviation. Able to handel secretions. No signs of ludwig angina.   Neck: Normal range of motion. Neck supple.  Cardiovascular: Normal rate and regular rhythm.   Pulmonary/Chest: Effort normal and breath sounds normal. She has no wheezes. She has no rales.  Lymphadenopathy:       Head (right side): No submental, no submandibular, no tonsillar and no preauricular adenopathy present.       Head (left side): Tonsillar adenopathy present. No submental and no preauricular adenopathy present.       Right cervical: No superficial cervical adenopathy present.      Left cervical: No superficial cervical adenopathy present.  ED Course  Procedures (including critical care time) Labs Review Labs Reviewed  RAPID STREP SCREEN - Abnormal; Notable for the following:    Streptococcus, Group A Screen (Direct) POSITIVE (*)    All other components within normal limits   Imaging Review No results found.  EKG Interpretation   None       MDM   1. Peritonsillar abscess   2. Pregnancy    Pt with a history of peritonsillar abscess presents with similar complaints.  Will give antibiotics and follow up with her ENT. Discussed patient history, condition, and  labs with Dr. Effie Shy  who agrees the patient can be evaluated as an out-pt. Discussed lab results and treatment plan with the patient. Return precautions given. Reports understanding and no other concerns at this time.  Patient is stable for discharge at this time.  Meds given in ED:  Medications  acetaminophen (TYLENOL) tablet 650 mg (650 mg Oral Given 02/16/13 1008)    Discharge Medication List as of 02/16/2013  9:55 AM    START taking these medications   Details  clindamycin (CLEOCIN) 150 MG capsule Take 2 capsules (300 mg total) by mouth 3 (three) times daily with meals., Starting 02/16/2013, Until Discontinued, Print             [name], PA-C 02/19/13 [phone]

## 2013-02-19 NOTE — ED Provider Notes (Signed)
Medical screening examination/treatment/procedure(s) were performed by non-physician practitioner and as supervising physician I was immediately available for consultation/collaboration.  EKG Interpretation   None        Flint MelterElliott L Nomar Broad, MD 02/19/13 1623

## 2013-03-04 ENCOUNTER — Encounter: Payer: Medicaid Other | Admitting: Advanced Practice Midwife

## 2013-03-21 ENCOUNTER — Encounter: Payer: Self-pay | Admitting: Advanced Practice Midwife

## 2013-03-21 ENCOUNTER — Institutional Professional Consult (permissible substitution): Payer: Medicaid Other | Admitting: Advanced Practice Midwife

## 2013-03-25 ENCOUNTER — Institutional Professional Consult (permissible substitution): Payer: Medicaid Other | Admitting: Advanced Practice Midwife

## 2013-04-07 ENCOUNTER — Encounter: Payer: Self-pay | Admitting: Obstetrics

## 2013-04-07 ENCOUNTER — Ambulatory Visit (INDEPENDENT_AMBULATORY_CARE_PROVIDER_SITE_OTHER): Payer: Medicaid Other | Admitting: Obstetrics

## 2013-04-07 VITALS — BP 124/82 | HR 72 | Temp 97.4°F | Ht 64.0 in | Wt 178.0 lb

## 2013-04-07 DIAGNOSIS — Z30017 Encounter for initial prescription of implantable subdermal contraceptive: Secondary | ICD-10-CM

## 2013-04-07 DIAGNOSIS — IMO0001 Reserved for inherently not codable concepts without codable children: Secondary | ICD-10-CM

## 2013-04-07 DIAGNOSIS — Z0000 Encounter for general adult medical examination without abnormal findings: Secondary | ICD-10-CM

## 2013-04-07 DIAGNOSIS — Z3202 Encounter for pregnancy test, result negative: Secondary | ICD-10-CM

## 2013-04-07 LAB — POCT URINE PREGNANCY: Preg Test, Ur: NEGATIVE

## 2013-04-07 MED ORDER — VITAFOL-ONE 29-1-200 MG PO CAPS: 1.0000 | ORAL_CAPSULE | Freq: Every day | ORAL | Status: DC

## 2013-04-07 MED ORDER — ETONOGESTREL 68 MG ~~LOC~~ IMPL: 68.0000 mg | DRUG_IMPLANT | Freq: Once | SUBCUTANEOUS | Status: DC

## 2013-04-07 NOTE — Progress Notes (Signed)
Nexplanon Procedure Note   PRE-OP DIAGNOSIS: desired long-term, reversible contraception  POST-OP DIAGNOSIS: Same  PROCEDURE: Nexplanon  placement Performing Provider: Rawn Quiroa MD  Patient education prior to procedure, explained risk, benefits of Nexplanon, reviewed alternative options. Patient reported understanding. Gave consent to continue with procedure.   PROCEDURE:  Pregnancy Text :  Negative Site (check):      left arm         Sterile Preparation:   [id number] Lot # [id number] / [id number] Expiration Date 07 / 2017  Insertion site was selected 8 - 10 cm from medial epicondyle and marked along with guiding site using sterile marker. Procedure area was prepped and draped in a sterile fashion. Nexplanon  was inserted subcutaneously.Needle was removed from the insertion site. Nexplanon capsule was palpated by provider and patient to assure satisfactory placement. Dressing applied.  Followup: The patient tolerated the procedure well without complications.  Standard post-procedure care is explained and return precautions are given.  Kathleen Boschert MD 

## 2013-04-07 NOTE — Progress Notes (Signed)
Subjective:     Kathleen Esparza is a 23 y.o. female here for a routine exam.  Current complaints: Patient in office today for a birth control consult. Patient states she does not know what kind of birth control she would like.  Personal health questionnaire reviewed: yes.   Gynecologic History Patient's last menstrual period was 04/06/2013. Contraception: condoms Last Pap: 02/03/2013. Results were: normal  Obstetric History OB History  Gravida Para Term Preterm AB SAB TAB Ectopic Multiple Living  4 1 1  3  3   1     # Outcome Date GA Lbr Len/2nd Weight Sex Delivery Anes PTL Lv  4 TAB 2015        N     Comments: System Generated. Please review and update pregnancy details.  3 TAB 01/2012          2 TRM 04/20/09 [id number]   F SVD EPI  Y  1 TAB              Comments: System Generated. Please review and update pregnancy details.       The following portions of the patient's history were reviewed and updated as appropriate: allergies, current medications, past family history, past medical history, past social history, past surgical history and problem list.  Review of Systems Pertinent items are noted in HPI.    Objective:    No exam performed today, Consult only.    Assessment:    Counseling for birth control.   Plan:    Education reviewed: safe sex/STD prevention and contraceptive options. Contraception: Nexplanon. Follow up in: 2 weeks. Nexplanon Rx

## 2013-04-23 ENCOUNTER — Ambulatory Visit: Payer: Medicaid Other | Admitting: Obstetrics

## 2013-05-16 ENCOUNTER — Encounter (HOSPITAL_COMMUNITY): Payer: Self-pay | Admitting: Emergency Medicine

## 2013-05-16 ENCOUNTER — Emergency Department (HOSPITAL_COMMUNITY)
Admission: EM | Admit: 2013-05-16 | Discharge: 2013-05-16 | Disposition: A | Discharge status: Home / Self Care | Payer: Medicaid Other | Attending: Emergency Medicine | Admitting: Emergency Medicine

## 2013-05-16 DIAGNOSIS — S199XXA Unspecified injury of neck, initial encounter: Principal | ICD-10-CM

## 2013-05-16 DIAGNOSIS — S0993XA Unspecified injury of face, initial encounter: Secondary | ICD-10-CM | POA: Insufficient documentation

## 2013-05-16 DIAGNOSIS — Y9389 Activity, other specified: Secondary | ICD-10-CM | POA: Insufficient documentation

## 2013-05-16 DIAGNOSIS — Y92410 Unspecified street and highway as the place of occurrence of the external cause: Secondary | ICD-10-CM | POA: Insufficient documentation

## 2013-05-16 MED ORDER — IBUPROFEN 600 MG PO TABS: 600.0000 mg | ORAL_TABLET | Freq: Four times a day (QID) | ORAL | Status: DC | PRN

## 2013-05-16 MED ORDER — CYCLOBENZAPRINE HCL 10 MG PO TABS
5.0000 mg | ORAL_TABLET | Freq: Once | ORAL | Status: AC
  Administered 2013-05-16: 5 mg via ORAL
  Filled 2013-05-16: qty 1

## 2013-05-16 MED ORDER — IBUPROFEN 200 MG PO TABS
600.0000 mg | ORAL_TABLET | Freq: Once | ORAL | Status: AC
Start: 2013-05-16 — End: 2013-05-16
  Administered 2013-05-16: 600 mg via ORAL
  Filled 2013-05-16: qty 3

## 2013-05-16 MED ORDER — CYCLOBENZAPRINE HCL 5 MG PO TABS: 5.0000 mg | ORAL_TABLET | Freq: Three times a day (TID) | ORAL | Status: DC | PRN
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
## 2013-05-17 NOTE — ED Provider Notes (Signed)
Medical screening examination/treatment/procedure(s) were performed by non-physician practitioner and as supervising physician I was immediately available for consultation/collaboration.   Megan E Docherty, MD 05/17/13 1041 

## 2013-07-22 ENCOUNTER — Emergency Department (HOSPITAL_COMMUNITY): Payer: Medicaid Other

## 2013-07-22 ENCOUNTER — Emergency Department (HOSPITAL_COMMUNITY)
Admission: EM | Admit: 2013-07-22 | Discharge: 2013-07-22 | Disposition: A | Discharge status: Home / Self Care | Payer: Medicaid Other | Attending: Emergency Medicine | Admitting: Emergency Medicine

## 2013-07-22 ENCOUNTER — Encounter (HOSPITAL_COMMUNITY): Payer: Self-pay | Admitting: Emergency Medicine

## 2013-07-22 DIAGNOSIS — S93409A Sprain of unspecified ligament of unspecified ankle, initial encounter: Secondary | ICD-10-CM | POA: Insufficient documentation

## 2013-07-22 DIAGNOSIS — R269 Unspecified abnormalities of gait and mobility: Secondary | ICD-10-CM | POA: Insufficient documentation

## 2013-07-22 DIAGNOSIS — Y9301 Activity, walking, marching and hiking: Secondary | ICD-10-CM | POA: Insufficient documentation

## 2013-07-22 DIAGNOSIS — Y92009 Unspecified place in unspecified non-institutional (private) residence as the place of occurrence of the external cause: Secondary | ICD-10-CM | POA: Insufficient documentation

## 2013-07-22 DIAGNOSIS — S93401A Sprain of unspecified ligament of right ankle, initial encounter: Secondary | ICD-10-CM

## 2013-07-22 DIAGNOSIS — X500XXA Overexertion from strenuous movement or load, initial encounter: Secondary | ICD-10-CM | POA: Insufficient documentation

## 2013-07-22 MED ORDER — IBUPROFEN 800 MG PO TABS: 800.0000 mg | ORAL_TABLET | Freq: Three times a day (TID) | ORAL | Status: DC

## 2013-07-22 MED ORDER — IBUPROFEN 800 MG PO TABS
800.0000 mg | ORAL_TABLET | Freq: Once | ORAL | Status: AC
Start: 2013-07-22 — End: 2013-07-22
  Administered 2013-07-22: 800 mg via ORAL
  Filled 2013-07-22: qty 1

## 2013-07-22 NOTE — ED Notes (Signed)
Pt stated that she was walking down her steps and "missed" a step, rotating r/foot outward. Pt is unable to bear weight due to pain

## 2013-07-22 NOTE — ED Provider Notes (Signed)
CSN: [id number]     Arrival date & time 07/22/13  7858 History   First MD Initiated Contact with Patient 07/22/13 1009     Chief Complaint  Patient presents with  . Ankle Pain    "twisted r/ankle today"     (Consider location/radiation/quality/duration/timing/severity/associated sxs/prior Treatment) HPI Comments: Patient is a 23 year old female who presents today after she twisted her ankle. She states that she was walking down her steps when she missed a step. She inverted her right ankle. At that time she has been unable to bear weight to this ankle. It is a throbbing pain. She has not tried anything for her pain. She's never injured this ankle in the past. She does have some associated tingling without numbness. She did not have any other injuries during the fall.  The history is provided by the patient. No language interpreter was used.    Past Medical History  Diagnosis Date  . No pertinent past medical history   . Medical history non-contributory    Past Surgical History  Procedure Laterality Date  . Induced abortion     Family History  Problem Relation Age of Onset  . Other Neg Hx   . COPD Maternal Grandmother   . Diabetes Maternal Grandmother    History  Substance Use Topics  . Smoking status: Never Smoker   . Smokeless tobacco: Never Used  . Alcohol Use: No   OB History   Grav Para Term Preterm Abortions TAB SAB Ect Mult Living   4 1 1  3 3    1      Review of Systems  Constitutional: Negative for fever and chills.  Gastrointestinal: Negative for nausea and vomiting.  Musculoskeletal: Positive for arthralgias, gait problem, joint swelling and myalgias.  Neurological: Negative for light-headedness and headaches.  All other systems reviewed and are negative.     Allergies  Review of patient's allergies indicates no known allergies.  Home Medications   Prior to Admission medications   Medication Sig Start Date End Date Taking? Authorizing Provider   cyclobenzaprine (FLEXERIL) 5 MG tablet Take 1 tablet (5 mg total) by mouth 3 (three) times daily as needed for muscle spasms. 05/16/13   Arman Filter, NP  ibuprofen (ADVIL,MOTRIN) 600 MG tablet Take 1 tablet (600 mg total) by mouth every 6 (six) hours as needed. 05/16/13   Arman Filter, NP   BP 124/82  Pulse 77  Temp(Src) 98.1 F (36.7 C) (Oral)  Resp 16  SpO2 98%  LMP 07/15/2013  Breastfeeding? No Physical Exam  Nursing note and vitals reviewed. Constitutional: She is oriented to person, place, and time. She appears well-developed and well-nourished. No distress.  HENT:  Head: Normocephalic and atraumatic.  Right Ear: External ear normal.  Left Ear: External ear normal.  Nose: Nose normal.  Mouth/Throat: Oropharynx is clear and moist.  Eyes: Conjunctivae are normal.  Neck: Normal range of motion.  Cardiovascular: Normal rate, regular rhythm, normal heart sounds, intact distal pulses and normal pulses.   Pulses:      Dorsalis pedis pulses are 2+ on the right side.       Posterior tibial pulses are 2+ on the right side.  Pulmonary/Chest: Effort normal and breath sounds normal. No stridor. No respiratory distress. She has no wheezes. She has no rales.  Abdominal: Soft. She exhibits no distension.  Musculoskeletal: Normal range of motion.  Tender to palpation diffusely over right ankle and foot. Maximal tenderness is posterior to the lateral malleolus. Range  of motion limited due to pain. Joint is stable. Compartment soft. Neurovascularly intact.  Neurological: She is alert and oriented to person, place, and time. She has normal strength.  Skin: Skin is warm and dry. She is not diaphoretic. No erythema.  Psychiatric: She has a normal mood and affect. Her behavior is normal.    ED Course  Procedures (including critical care time) Labs Review Labs Reviewed - No data to display  Imaging Review Dg Ankle Complete Right  07/22/2013   CLINICAL DATA:  Pain post trauma  EXAM: RIGHT ANKLE  - COMPLETE 3+ VIEW  COMPARISON:  Jun 17, 2010  FINDINGS: Frontal, oblique, and lateral views were obtained. There is no fracture or effusion. Ankle mortise appears intact.  IMPRESSION: No abnormality noted.   Electronically Signed   By: Bretta BangWilliam  Woodruff M.D.   On: 07/22/2013 10:33   Dg Foot Complete Right  07/22/2013   CLINICAL DATA:  Pain post trauma  EXAM: RIGHT FOOT COMPLETE - 3+ VIEW  COMPARISON:  None.  FINDINGS: Frontal, oblique, and lateral views were obtained. There is no fracture or dislocation. Joint spaces appear intact. No erosive change.  IMPRESSION: No abnormality noted.   Electronically Signed   By: Bretta BangWilliam  Woodruff M.D.   On: 07/22/2013 10:32     EKG Interpretation None      MDM   Final diagnoses:  Right ankle sprain   Patient presents to the emergency department for right ankle pain after injury this morning. X-ray shows no fracture. Neurovascularly intact. Compartment soft. Patient was given ASO brace and crutches for comfort. Encouraged rest, ice, elevation, compression, NSAIDs. Return instructions given. Vital signs stable for discharge. Patient / Family / Caregiver informed of clinical course, understand medical decision-making process, and agree with plan.   Mora BellmanHannah S Billye Nydam, PA-C 07/22/13 1047

## 2013-07-22 NOTE — ED Provider Notes (Signed)
Medical screening examination/treatment/procedure(s) were performed by non-physician practitioner and as supervising physician I was immediately available for consultation/collaboration.   Savahna Casados M Jamilia Jacques, MD 07/22/13 1642 

## 2013-07-22 NOTE — Discharge Instructions (Signed)
Acute Ankle Sprain  with Phase I Rehab  An acute ankle sprain is a partial or complete tear in one or more of the ligaments of the ankle due to traumatic injury. The severity of the injury depends on both the the number of ligaments sprained and the grade of sprain. There are 3 grades of sprains.   · A grade 1 sprain is a mild sprain. There is a slight pull without obvious tearing. There is no loss of strength, and the muscle and ligament are the correct length.  · A grade 2 sprain is a moderate sprain. There is tearing of fibers within the substance of the ligament where it connects two bones or two cartilages. The length of the ligament is increased, and there is usually decreased strength.  · A grade 3 sprain is a complete rupture of the ligament and is uncommon.  In addition to the grade of sprain, there are three types of ankle sprains.   Lateral ankle sprains: This is a sprain of one or more of the three ligaments on the outer side (lateral) of the ankle. These are the most common sprains.  Medial ankle sprains: There is one large triangular ligament of the inner side (medial) of the ankle that is susceptible to injury. Medial ankle sprains are less common.  Syndesmosis, "high ankle," sprains: The syndesmosis is the ligament that connects the two bones of the lower leg. Syndesmosis sprains usually only occur with very severe ankle sprains.  SYMPTOMS  · Pain, tenderness, and swelling in the ankle, starting at the side of injury that may progress to the whole ankle and foot with time.  · "Pop" or tearing sensation at the time of injury.  · Bruising that may spread to the heel.  · Impaired ability to walk soon after injury.  CAUSES   · Acute ankle sprains are caused by trauma placed on the ankle that temporarily forces or pries the anklebone (talus) out of its normal socket.  · Stretching or tearing of the ligaments that normally hold the joint in place (usually due to a twisting injury).  RISK INCREASES  WITH:  · Previous ankle sprain.  · Sports in which the foot may land awkwardly (ie. basketball, volleyball, or soccer) or walking or running on uneven or rough surfaces.  · Shoes with inadequate support to prevent sideways motion when stress occurs.  · Poor strength and flexibility.  · Poor balance skills.  · Contact sports.  PREVENTION   · Warm up and stretch properly before activity.  · Maintain physical fitness:  · Ankle and leg flexibility, muscle strength, and endurance.  · Cardiovascular fitness.  · Balance training activities.  · Use proper technique and have a coach correct improper technique.  · Taping, protective strapping, bracing, or high-top tennis shoes may help prevent injury. Initially, tape is best; however, it loses most of its support function within 10 to 15 minutes.  · Wear proper fitted protective shoes (High-top shoes with taping or bracing is more effective than either alone).  · Provide the ankle with support during sports and practice activities for 12 months following injury.  PROGNOSIS   · If treated properly, ankle sprains can be expected to recover completely; however, the length of recovery depends on the degree of injury.  · A grade 1 sprain usually heals enough in 5 to 7 days to allow modified activity and requires an average of 6 weeks to heal completely.  · A grade 2 sprain requires   6 to 10 weeks to heal completely.  · A grade 3 sprain requires 12 to 16 weeks to heal.  · A syndesmosis sprain often takes more than 3 months to heal.  RELATED COMPLICATIONS   · Frequent recurrence of symptoms may result in a chronic problem. Appropriately addressing the problem the first time decreases the frequency of recurrence and optimizes healing time. Severity of the initial sprain does not predict the likelihood of later instability.  · Injury to other structures (bone, cartilage, or tendon).  · A chronically unstable or arthritic ankle joint is a possiblity with repeated  sprains.  TREATMENT  Treatment initially involves the use of ice, medication, and compression bandages to help reduce pain and inflammation. Ankle sprains are usually immobilized in a walking cast or boot to allow for healing. Crutches may be recommended to reduce pressure on the injury. After immobilization, strengthening and stretching exercises may be necessary to regain strength and a full range of motion. Surgery is rarely needed to treat ankle sprains.  MEDICATION   · Nonsteroidal anti-inflammatory medications, such as aspirin and ibuprofen (do not take for the first 3 days after injury or within 7 days before surgery), or other minor pain relievers, such as acetaminophen, are often recommended. Take these as directed by your caregiver. Contact your caregiver immediately if any bleeding, stomach upset, or signs of an allergic reaction occur from these medications.  · Ointments applied to the skin may be helpful.  · Pain relievers may be prescribed as necessary by your caregiver. Do not take prescription pain medication for longer than 4 to 7 days. Use only as directed and only as much as you need.  HEAT AND COLD  · Cold treatment (icing) is used to relieve pain and reduce inflammation for acute and chronic cases. Cold should be applied for 10 to 15 minutes every 2 to 3 hours for inflammation and pain and immediately after any activity that aggravates your symptoms. Use ice packs or an ice massage.  · Heat treatment may be used before performing stretching and strengthening activities prescribed by your caregiver. Use a heat pack or a warm soak.  SEEK IMMEDIATE MEDICAL CARE IF:   · Pain, swelling, or bruising worsens despite treatment.  · You experience pain, numbness, discoloration, or coldness in the foot or toes.  · New, unexplained symptoms develop (drugs used in treatment may produce side effects.)  EXERCISES   PHASE I EXERCISES  RANGE OF MOTION (ROM) AND STRETCHING EXERCISES - Ankle Sprain, Acute Phase I,  Weeks 1 to 2  These exercises may help you when beginning to restore flexibility in your ankle. You will likely work on these exercises for the 1 to 2 weeks after your injury. Once your physician, physical therapist, or athletic trainer sees adequate progress, he or she will advance your exercises. While completing these exercises, remember:   · Restoring tissue flexibility helps normal motion to return to the joints. This allows healthier, less painful movement and activity.  · An effective stretch should be held for at least 30 seconds.  · A stretch should never be painful. You should only feel a gentle lengthening or release in the stretched tissue.  RANGE OF MOTION - Dorsi/Plantar Flexion  · While sitting with your right / left knee straight, draw the top of your foot upwards by flexing your ankle. Then reverse the motion, pointing your toes downward.  · Hold each position for __________ seconds.  · After completing your first set of   exercises, repeat this exercise with your knee bent.  Repeat __________ times. Complete this exercise __________ times per day.   RANGE OF MOTION - Ankle Alphabet  · Imagine your right / left big toe is a pen.  · Keeping your hip and knee still, write out the entire alphabet with your "pen." Make the letters as large as you can without increasing any discomfort.  Repeat __________ times. Complete this exercise __________ times per day.   STRENGTHENING EXERCISES - Ankle Sprain, Acute -Phase I, Weeks 1 to 2  These exercises may help you when beginning to restore strength in your ankle. You will likely work on these exercises for 1 to 2 weeks after your injury. Once your physician, physical therapist, or athletic trainer sees adequate progress, he or she will advance your exercises. While completing these exercises, remember:   · Muscles can gain both the endurance and the strength needed for everyday activities through controlled exercises.  · Complete these exercises as instructed by  your physician, physical therapist, or athletic trainer. Progress the resistance and repetitions only as guided.  · You may experience muscle soreness or fatigue, but the pain or discomfort you are trying to eliminate should never worsen during these exercises. If this pain does worsen, stop and make certain you are following the directions exactly. If the pain is still present after adjustments, discontinue the exercise until you can discuss the trouble with your clinician.  STRENGTH - Dorsiflexors  · Secure a rubber exercise band/tubing to a fixed object (ie. table, pole) and loop the other end around your right / left foot.  · Sit on the floor facing the fixed object. The band/tubing should be slightly tense when your foot is relaxed.  · Slowly draw your foot back toward you using your ankle and toes.  · Hold this position for __________ seconds. Slowly release the tension in the band and return your foot to the starting position.  Repeat __________ times. Complete this exercise __________ times per day.   STRENGTH - Plantar-flexors   · Sit with your right / left leg extended. Holding onto both ends of a rubber exercise band/tubing, loop it around the ball of your foot. Keep a slight tension in the band.  · Slowly push your toes away from you, pointing them downward.  · Hold this position for __________ seconds. Return slowly, controlling the tension in the band/tubing.  Repeat __________ times. Complete this exercise __________ times per day.   STRENGTH - Ankle Eversion  · Secure one end of a rubber exercise band/tubing to a fixed object (table, pole). Loop the other end around your foot just before your toes.  · Place your fists between your knees. This will focus your strengthening at your ankle.  · Drawing the band/tubing across your opposite foot, slowly, pull your little toe out and up. Make sure the band/tubing is positioned to resist the entire motion.  · Hold this position for __________ seconds.  Have  your muscles resist the band/tubing as it slowly pulls your foot back to the starting position.   Repeat __________ times. Complete this exercise __________ times per day.   STRENGTH - Ankle Inversion  · Secure one end of a rubber exercise band/tubing to a fixed object (table, pole). Loop the other end around your foot just before your toes.  · Place your fists between your knees. This will focus your strengthening at your ankle.  · Slowly, pull your big toe up and in, making   sure the band/tubing is positioned to resist the entire motion.  · Hold this position for __________ seconds.  · Have your muscles resist the band/tubing as it slowly pulls your foot back to the starting position.  Repeat __________ times. Complete this exercises __________ times per day.   STRENGTH - Towel Curls  · Sit in a chair positioned on a non-carpeted surface.  · Place your right / left foot on a towel, keeping your heel on the floor.  · Pull the towel toward your heel by only curling your toes. Keep your heel on the floor.  · If instructed by your physician, physical therapist, or athletic trainer, add weight to the end of the towel.  Repeat __________ times. Complete this exercise __________ times per day.  Document Released: 08/31/2004 Document Revised: 04/24/2011 Document Reviewed: 05/14/2008  [employer]® Patient Information ©2014 [employer].

## 2013-12-15 ENCOUNTER — Encounter (HOSPITAL_COMMUNITY): Payer: Self-pay | Admitting: Emergency Medicine

## 2014-02-09 ENCOUNTER — Encounter: Payer: Self-pay | Admitting: *Deleted

## 2014-02-13 HISTORY — PX: TONSILLECTOMY: SUR1361

## 2014-06-02 ENCOUNTER — Ambulatory Visit: Payer: Medicaid Other | Admitting: Certified Nurse Midwife

## 2014-06-02 ENCOUNTER — Inpatient Hospital Stay (HOSPITAL_COMMUNITY)
Admission: AD | Admit: 2014-06-02 | Discharge: 2014-06-02 | Discharge status: Against Medical Advice | Payer: Medicaid Other | Source: Ambulatory Visit | Attending: Obstetrics | Admitting: Obstetrics

## 2014-06-02 DIAGNOSIS — M79602 Pain in left arm: Secondary | ICD-10-CM | POA: Insufficient documentation

## 2014-06-02 LAB — POCT PREGNANCY, URINE: Preg Test, Ur: NEGATIVE

## 2014-06-02 NOTE — MAU Note (Signed)
Pain in L upper arm near site of Nexplanon which was placed Nov 2014. Pain is sharp and shooting since yesterday.

## 2014-06-03 ENCOUNTER — Ambulatory Visit (INDEPENDENT_AMBULATORY_CARE_PROVIDER_SITE_OTHER): Payer: Medicaid Other | Admitting: Certified Nurse Midwife

## 2014-06-03 ENCOUNTER — Encounter: Payer: Self-pay | Admitting: Certified Nurse Midwife

## 2014-06-03 VITALS — BP 126/82 | HR 83 | Temp 98.7°F | Ht 64.0 in | Wt 182.0 lb

## 2014-06-03 DIAGNOSIS — B3731 Acute candidiasis of vulva and vagina: Secondary | ICD-10-CM

## 2014-06-03 DIAGNOSIS — B373 Candidiasis of vulva and vagina: Secondary | ICD-10-CM | POA: Diagnosis not present

## 2014-06-03 MED ORDER — BUTOCONAZOLE NITRATE (1 DOSE) 2 % VA CREA: 1.0000 | TOPICAL_CREAM | Freq: Once | VAGINAL | Status: DC

## 2014-06-03 NOTE — Progress Notes (Signed)
Patient ID: Kathleen Esparza, female   DOB: 11/20/1990, 24 y.o.   MRN: [id number]   Chief Complaint  Patient presents with  . Vaginitis    white discharge, denies itching odor or burning    HPI Kathleen MawLaquisha S Vold is a 24 y.o. female.  C/O large amount of white vaginal discharge, denies pruritis.   Has Nexplanon since 2014, with irregular periods.  LMP 04/18/14 lasted 3 days.  Does not desire pregnancy right now is going to school for [employer].  Works full time.     HPI  Past Medical History  Diagnosis Date  . No pertinent past medical history   . Medical history non-contributory     Past Surgical History  Procedure Laterality Date  . Induced abortion      Family History  Problem Relation Age of Onset  . Other Neg Hx   . COPD Maternal Grandmother   . Diabetes Maternal Grandmother     Social History History  Substance Use Topics  . Smoking status: Never Smoker   . Smokeless tobacco: Never Used  . Alcohol Use: No    No Known Allergies  Current Outpatient Prescriptions  Medication Sig Dispense Refill  . Butoconazole Nitrate, 1 Dose, (GYNAZOLE-1) 2 % CREA Place 1 applicator vaginally once. 5.8 g 0   Current Facility-Administered Medications  Medication Dose Route Frequency Provider Last Rate Last Dose  . etonogestrel (IMPLANON) implant 68 mg  68 mg Subcutaneous Once Brock Badharles A Harper, MD        Review of Systems Review of Systems Constitutional: negative for fatigue and weight loss Respiratory: negative for cough and wheezing Cardiovascular: negative for chest pain, fatigue and palpitations Gastrointestinal: negative for abdominal pain and change in bowel habits Genitourinary:+ white vaginal discharge Integument/breast: negative for nipple discharge Musculoskeletal:negative for myalgias Neurological: negative for gait problems and tremors Behavioral/Psych: negative for abusive relationship, depression Endocrine: negative for temperature intolerance     Blood pressure  126/82, pulse 83, temperature 98.7 F (37.1 C), height 5\' 4"  (1.626 m), weight 82.555 kg (182 lb), last menstrual period 04/18/2014.  Physical Exam Physical Exam General:   alert  Skin:   no rash or abnormalities  Lungs:   clear to auscultation bilaterally  Heart:   regular rate and rhythm, S1, S2 normal, no murmur, click, rub or gallop  Breasts:  deferred  Abdomen:  normal findings: no organomegaly, soft, non-tender and no hernia  Pelvis:  External genitalia: normal general appearance  Deferred    75% of 15 min visit spent on counseling and coordination of care.   Data Reviewed Previous medical hx, labs, meds   Assessment     Vulvovaginal Candidiasis     Plan    Orders Placed This Encounter  Procedures  . SureSwab, Vaginosis/Vaginitis Plus   Meds ordered this encounter  Medications  . Butoconazole Nitrate, 1 Dose, (GYNAZOLE-1) 2 % CREA    Sig: Place 1 applicator vaginally once.    Dispense:  5.8 g    Refill:  0     Possible management options include: Monistat 1/Terconazole Follow up with annual exam.

## 2014-06-07 LAB — SURESWAB, VAGINOSIS/VAGINITIS PLUS
Atopobium vaginae: 6.5 Log (cells/mL)
C. GLABRATA, DNA: NOT DETECTED
C. albicans, DNA: NOT DETECTED
C. parapsilosis, DNA: NOT DETECTED
C. trachomatis RNA, TMA: NOT DETECTED
C. tropicalis, DNA: NOT DETECTED
Gardnerella vaginalis: >8 Log (cells/mL)
LACTOBACILLUS SPECIES: NOT DETECTED Log (cells/mL)
MEGASPHAERA SPECIES: 8 Log (cells/mL)
N. GONORRHOEAE RNA, TMA: NOT DETECTED
T. vaginalis RNA, QL TMA: NOT DETECTED

## 2014-06-15 ENCOUNTER — Telehealth: Payer: Self-pay | Admitting: *Deleted

## 2014-06-15 DIAGNOSIS — N760 Acute vaginitis: Principal | ICD-10-CM

## 2014-06-15 DIAGNOSIS — B9689 Other specified bacterial agents as the cause of diseases classified elsewhere: Secondary | ICD-10-CM

## 2014-06-15 MED ORDER — METRONIDAZOLE 500 MG PO TABS: 500.0000 mg | ORAL_TABLET | Freq: Two times a day (BID) | ORAL | Status: DC

## 2014-06-15 NOTE — Telephone Encounter (Signed)
Patient called for lab results. 2:36 After review patient does have BV and she was not treated. Patient is still having symptoms and request oral treatment. Rx sent to pharmacy and reviewed reasons for BV vs yeast and ways to avoid chronic problems.

## 2014-06-17 ENCOUNTER — Ambulatory Visit: Payer: Self-pay | Admitting: Certified Nurse Midwife

## 2014-10-27 ENCOUNTER — Encounter: Payer: Self-pay | Admitting: Certified Nurse Midwife

## 2014-10-27 ENCOUNTER — Ambulatory Visit (INDEPENDENT_AMBULATORY_CARE_PROVIDER_SITE_OTHER): Payer: Medicaid Other | Admitting: Certified Nurse Midwife

## 2014-10-27 VITALS — BP 126/82 | HR 93 | Temp 98.9°F | Wt 181.0 lb

## 2014-10-27 DIAGNOSIS — Z0000 Encounter for general adult medical examination without abnormal findings: Secondary | ICD-10-CM

## 2014-10-27 DIAGNOSIS — Z113 Encounter for screening for infections with a predominantly sexual mode of transmission: Secondary | ICD-10-CM

## 2014-10-27 DIAGNOSIS — Z01419 Encounter for gynecological examination (general) (routine) without abnormal findings: Secondary | ICD-10-CM | POA: Diagnosis not present

## 2014-10-27 DIAGNOSIS — Z3009 Encounter for other general counseling and advice on contraception: Secondary | ICD-10-CM

## 2014-10-27 DIAGNOSIS — N760 Acute vaginitis: Secondary | ICD-10-CM

## 2014-10-27 DIAGNOSIS — B9689 Other specified bacterial agents as the cause of diseases classified elsewhere: Secondary | ICD-10-CM

## 2014-10-27 LAB — COMPREHENSIVE METABOLIC PANEL
ALBUMIN: 4.3 g/dL (ref 3.6–5.1)
ALT: 9 U/L (ref 6–29)
AST: 15 U/L (ref 10–30)
Alkaline Phosphatase: 69 U/L (ref 33–115)
BUN: 9 mg/dL (ref 7–25)
CHLORIDE: 105 mmol/L (ref 98–110)
CO2: 25 mmol/L (ref 20–31)
Calcium: 9.1 mg/dL (ref 8.6–10.2)
Creat: 0.7 mg/dL (ref 0.50–1.10)
Glucose, Bld: 86 mg/dL (ref 65–99)
Potassium: 3.9 mmol/L (ref 3.5–5.3)
Sodium: 139 mmol/L (ref 135–146)
Total Bilirubin: 0.5 mg/dL (ref 0.2–1.2)
Total Protein: 6.9 g/dL (ref 6.1–8.1)

## 2014-10-27 LAB — TRIGLYCERIDES: Triglycerides: 45 mg/dL (ref <150)

## 2014-10-27 LAB — HDL CHOLESTEROL: HDL: 44 mg/dL — ABNORMAL LOW (ref >=46)

## 2014-10-27 LAB — TSH: TSH: 0.67 u[IU]/mL (ref 0.350–4.500)

## 2014-10-27 LAB — CHOLESTEROL, TOTAL: CHOLESTEROL: 151 mg/dL (ref 125–200)

## 2014-10-27 MED ORDER — TINIDAZOLE 500 MG PO TABS: 2.0000 g | ORAL_TABLET | Freq: Every day | ORAL | Status: AC

## 2014-10-27 NOTE — Progress Notes (Signed)
Patient ID: Kathleen Esparza, female   DOB: February 01, 1991, 24 y.o.   MRN: [id number]    Subjective:      Kathleen Esparza is a 24 y.o. female here for a routine exam.  Current complaints: vaginal discharge with odor for a couple of weeks, denies any itching, white to gray color.   Nexplanon for contraception, having irregular periods about every 3 months, lasting about 2 weeks, with cramping and clots about quarter sized.  Denies any new sexual partners.  Stopped douching after last GYN visit.  Has not changed soaps recently.  Working: [employer].  Desires full STD screening with general lab work. Had tonsillectomy last year for chronic tonsillitis.  Single parent.   Personal health questionnaire:  Is patient Ashkenazi Jewish, have a family history of breast and/or ovarian cancer: no Is there a family history of uterine cancer diagnosed at age < 49, gastrointestinal cancer, urinary tract cancer, family member who is a Personnel officer syndrome-associated carrier: no Is the patient overweight and hypertensive, family history of diabetes, personal history of gestational diabetes, preeclampsia or PCOS: no Is patient over 45, have PCOS,  family history of premature CHD under age 5, diabetes, smoke, have hypertension or peripheral artery disease:  Yes, MGM: DM, CVA & HTN At any time, has a partner hit, kicked or otherwise hurt or frightened you?: no Over the past 2 weeks, have you felt down, depressed or hopeless?: no Over the past 2 weeks, have you felt little interest or pleasure in doing things?:no   Gynecologic History Patient's last menstrual period was 09/18/2014. Contraception: Nexplanon Last Pap: 2014. Results were: normal Last mammogram: N/A.   Obstetric History OB History  Gravida Para Term Preterm AB SAB TAB Ectopic Multiple Living  4 1 1  3  3   1     # Outcome Date GA Lbr Len/2nd Weight Sex Delivery Anes PTL Lv  4 TAB 2015        N     Comments: System Generated. Please review and update  pregnancy details.  3 TAB 01/2012          2 Term 04/20/09 [redacted]w[redacted]d   F Vag-Spont EPI  Y  1 TAB              Comments: System Generated. Please review and update pregnancy details.      Past Medical History  Diagnosis Date  . No pertinent past medical history   . Medical history non-contributory     Past Surgical History  Procedure Laterality Date  . Induced abortion       Current outpatient prescriptions:  .  tinidazole (TINDAMAX) 500 MG tablet, Take 4 tablets (2,000 mg total) by mouth daily with breakfast., Disp: 12 tablet, Rfl: 0  Current facility-administered medications:  .  etonogestrel (IMPLANON) implant 68 mg, 68 mg, Subcutaneous, Once, Brock Bad, MD No Known Allergies  Social History  Substance Use Topics  . Smoking status: Never Smoker   . Smokeless tobacco: Never Used  . Alcohol Use: No    Family History  Problem Relation Age of Onset  . Other Neg Hx   . COPD Maternal Grandmother   . Diabetes Maternal Grandmother       Review of Systems  Constitutional: negative for fatigue and weight loss Respiratory: negative for cough and wheezing Cardiovascular: negative for chest pain, fatigue and palpitations Gastrointestinal: negative for abdominal pain and change in bowel habits Musculoskeletal:negative for myalgias Neurological: negative for gait problems and tremors Behavioral/Psych:  negative for abusive relationship, depression Endocrine: negative for temperature intolerance   Genitourinary:negative for abnormal menstrual periods, genital lesions, hot flashes, sexual problems and vaginal discharge Integument/breast: negative for breast lump, breast tenderness, nipple discharge and skin lesion(s)    Objective:       BP 126/82 mmHg  Pulse 93  Temp(Src) 98.9 F (37.2 C)  Wt 181 lb (82.101 kg)  LMP 09/18/2014 General:   alert  Skin:   no rash or abnormalities  Lungs:   clear to auscultation bilaterally  Heart:   regular rate and rhythm, S1, S2  normal, no murmur, click, rub or gallop  Breasts:   normal without suspicious masses, skin or nipple changes or axillary nodes  Abdomen:  normal findings: no organomegaly, soft, non-tender and no hernia  Pelvis:  External genitalia: normal general appearance Urinary system: urethral meatus normal and bladder without fullness, nontender Vaginal: normal without tenderness, induration or masses Cervix: normal appearance Adnexa: normal bimanual exam Uterus: retroverted and non-tender, normal size   Lab Review Urine pregnancy test Labs reviewed yes Radiologic studies reviewed no  50% of 30 min visit spent on counseling and coordination of care.   Assessment:    Healthy female exam.   BV AUB & Irregular periods with Nexplanon Contraception counseling  STD screening examination  Plan:    Education reviewed: calcium supplements, depression evaluation, low fat, low cholesterol diet, safe sex/STD prevention, self breast exams, skin cancer screening and weight bearing exercise. Contraception: Nexplanon. Follow up in: 5 months.   Meds ordered this encounter  Medications  . tinidazole (TINDAMAX) 500 MG tablet    Sig: Take 4 tablets (2,000 mg total) by mouth daily with breakfast.    Dispense:  12 tablet    Refill:  0   Orders Placed This Encounter  Procedures  . SureSwab, Vaginosis/Vaginitis Plus  . HIV antibody (with reflex)  . Hepatitis B surface antigen  . RPR  . Hepatitis C antibody  . CBC with Differential/Platelet  . Comprehensive metabolic panel  . TSH  . Cholesterol, total  . Triglycerides  . HDL cholesterol    Possible management options include: Mirena IUD in [place] Follow up as needed.

## 2014-10-28 LAB — PAP IG W/ RFLX HPV ASCU

## 2014-10-28 LAB — CBC WITH DIFFERENTIAL/PLATELET
Basophils Absolute: 0 10*3/uL (ref 0.0–0.1)
Basophils Relative: 0 % (ref 0–1)
Eosinophils Absolute: 0.1 10*3/uL (ref 0.0–0.7)
Eosinophils Relative: 1 % (ref 0–5)
HEMATOCRIT: 40.1 % (ref 36.0–46.0)
HEMOGLOBIN: 13.6 g/dL (ref 12.0–15.0)
Lymphocytes Relative: 34 % (ref 12–46)
Lymphs Abs: 3 10*3/uL (ref 0.7–4.0)
MCH: 31.1 pg (ref 26.0–34.0)
MCHC: 33.9 g/dL (ref 30.0–36.0)
MCV: 91.6 fL (ref 78.0–100.0)
MONOS PCT: 5 % (ref 3–12)
MPV: 10.3 fL (ref 8.6–12.4)
Monocytes Absolute: 0.4 10*3/uL (ref 0.1–1.0)
NEUTROS ABS: 5.3 10*3/uL (ref 1.7–7.7)
NEUTROS PCT: 60 % (ref 43–77)
Platelets: 341 10*3/uL (ref 150–400)
RBC: 4.38 MIL/uL (ref 3.87–5.11)
RDW: 13.6 % (ref 11.5–15.5)
WBC: 8.8 10*3/uL (ref 4.0–10.5)

## 2014-10-28 LAB — HEPATITIS B SURFACE ANTIGEN: Hepatitis B Surface Ag: NEGATIVE

## 2014-10-28 LAB — RPR

## 2014-10-28 LAB — HEPATITIS C ANTIBODY: HCV Ab: NEGATIVE

## 2014-10-28 LAB — HIV ANTIBODY (ROUTINE TESTING W REFLEX): HIV 1&2 Ab, 4th Generation: NONREACTIVE

## 2014-11-03 ENCOUNTER — Other Ambulatory Visit: Payer: Self-pay | Admitting: Certified Nurse Midwife

## 2014-11-03 LAB — SURESWAB, VAGINOSIS/VAGINITIS PLUS
Atopobium vaginae: 7 Log (cells/mL)
C. PARAPSILOSIS, DNA: NOT DETECTED
C. albicans, DNA: NOT DETECTED
C. glabrata, DNA: NOT DETECTED
C. trachomatis RNA, TMA: NOT DETECTED
C. tropicalis, DNA: NOT DETECTED
LACTOBACILLUS SPECIES: NOT DETECTED Log (cells/mL)
MEGASPHAERA SPECIES: 7.9 Log (cells/mL)
N. gonorrhoeae RNA, TMA: NOT DETECTED
T. VAGINALIS RNA, QL TMA: NOT DETECTED

## 2015-01-09 ENCOUNTER — Emergency Department (HOSPITAL_COMMUNITY)
Admission: EM | Admit: 2015-01-09 | Discharge: 2015-01-09 | Disposition: A | Discharge status: Home / Self Care | Payer: Medicaid Other | Attending: Emergency Medicine | Admitting: Emergency Medicine

## 2015-01-09 ENCOUNTER — Encounter (HOSPITAL_COMMUNITY): Payer: Self-pay | Admitting: Neurology

## 2015-01-09 DIAGNOSIS — R109 Unspecified abdominal pain: Secondary | ICD-10-CM | POA: Diagnosis present

## 2015-01-09 DIAGNOSIS — N946 Dysmenorrhea, unspecified: Secondary | ICD-10-CM

## 2015-01-09 DIAGNOSIS — Z3202 Encounter for pregnancy test, result negative: Secondary | ICD-10-CM | POA: Diagnosis not present

## 2015-01-09 DIAGNOSIS — R1030 Lower abdominal pain, unspecified: Secondary | ICD-10-CM | POA: Diagnosis not present

## 2015-01-09 LAB — CBC WITH DIFFERENTIAL/PLATELET
Basophils Absolute: 0 10*3/uL (ref 0.0–0.1)
Basophils Relative: 0 %
Eosinophils Absolute: 0.1 10*3/uL (ref 0.0–0.7)
Eosinophils Relative: 1 %
HCT: 37.9 % (ref 36.0–46.0)
Hemoglobin: 13.1 g/dL (ref 12.0–15.0)
Lymphocytes Relative: 27 %
Lymphs Abs: 2.4 10*3/uL (ref 0.7–4.0)
MCH: 31.3 pg (ref 26.0–34.0)
MCHC: 34.6 g/dL (ref 30.0–36.0)
MCV: 90.5 fL (ref 78.0–100.0)
Monocytes Absolute: 0.4 10*3/uL (ref 0.1–1.0)
Monocytes Relative: 5 %
Neutro Abs: 6 10*3/uL (ref 1.7–7.7)
Neutrophils Relative %: 67 %
Platelets: 335 10*3/uL (ref 150–400)
RBC: 4.19 MIL/uL (ref 3.87–5.11)
RDW: 13 % (ref 11.5–15.5)
WBC: 8.9 10*3/uL (ref 4.0–10.5)

## 2015-01-09 LAB — URINE MICROSCOPIC-ADD ON
Bacteria, UA: NONE SEEN
WBC, UA: NONE SEEN WBC/hpf (ref 0–5)

## 2015-01-09 LAB — COMPREHENSIVE METABOLIC PANEL
ALT: 13 U/L — ABNORMAL LOW (ref 14–54)
AST: 17 U/L (ref 15–41)
Albumin: 3.6 g/dL (ref 3.5–5.0)
Alkaline Phosphatase: 65 U/L (ref 38–126)
Anion gap: 5 (ref 5–15)
BUN: 10 mg/dL (ref 6–20)
CO2: 27 mmol/L (ref 22–32)
Calcium: 8.9 mg/dL (ref 8.9–10.3)
Chloride: 106 mmol/L (ref 101–111)
Creatinine, Ser: 0.89 mg/dL (ref 0.44–1.00)
GFR calc Af Amer: >60 mL/min (ref >60)
GFR calc non Af Amer: >60 mL/min (ref >60)
Glucose, Bld: 101 mg/dL — ABNORMAL HIGH (ref 65–99)
Potassium: 3.8 mmol/L (ref 3.5–5.1)
Sodium: 138 mmol/L (ref 135–145)
Total Bilirubin: 0.4 mg/dL (ref 0.3–1.2)
Total Protein: 6.4 g/dL — ABNORMAL LOW (ref 6.5–8.1)

## 2015-01-09 LAB — URINALYSIS, ROUTINE W REFLEX MICROSCOPIC
Bilirubin Urine: NEGATIVE
Glucose, UA: NEGATIVE mg/dL
Ketones, ur: NEGATIVE mg/dL
Leukocytes, UA: NEGATIVE
Nitrite: NEGATIVE
Protein, ur: NEGATIVE mg/dL
Specific Gravity, Urine: 1.022 (ref 1.005–1.030)
pH: 8 (ref 5.0–8.0)

## 2015-01-09 LAB — PREGNANCY, URINE: Preg Test, Ur: NEGATIVE

## 2015-01-09 MED ORDER — SODIUM CHLORIDE 0.9 % IV BOLUS (SEPSIS)
1000.0000 mL | Freq: Once | INTRAVENOUS | Status: AC
  Administered 2015-01-09: 1000 mL via INTRAVENOUS

## 2015-01-09 MED ORDER — IBUPROFEN 800 MG PO TABS: 800.0000 mg | ORAL_TABLET | Freq: Three times a day (TID) | ORAL | Status: DC | PRN

## 2015-01-09 MED ORDER — KETOROLAC TROMETHAMINE 30 MG/ML IJ SOLN
30.0000 mg | Freq: Once | INTRAMUSCULAR | Status: AC
  Administered 2015-01-09: 30 mg via INTRAVENOUS
  Filled 2015-01-09: qty 1

## 2015-01-09 MED ORDER — HYDROCODONE-ACETAMINOPHEN 5-325 MG PO TABS: 1.0000 | ORAL_TABLET | Freq: Four times a day (QID) | ORAL | Status: DC | PRN

## 2015-01-09 NOTE — Discharge Instructions (Signed)
Return here as needed.  Follow-up with her primary care doctor.  Your testing here today does not show any significant abnormalities, rest as much as possible

## 2015-01-09 NOTE — ED Notes (Signed)
Pt reports lower abd cramping since last night; is on her period but pain is worse than normal and goes into her lower back.

## 2015-01-09 NOTE — ED Provider Notes (Signed)
CSN: [id number]     Arrival date & time 01/09/15  [id number] History   First MD Initiated Contact with Patient 01/09/15 0827     Chief Complaint  Patient presents with  . Abdominal Pain     (Consider location/radiation/quality/duration/timing/severity/associated sxs/prior Treatment) HPI Patient presents to the emergency department with lower abdominal cramping that started last night.  The patient states she started her period, but feels that this pain is worse than her normal.  And goes to her lower back.  The patient states that seems make his condition, better or worse.  She states that she did not take any medications prior to route for her symptoms.  She states that she has not had any chest pain, shortness of breath, weakness, dizziness, numbness, fever, dysuria, incontinence, bloody stool, hematemesis-near syncope or syncope.  The patient states that nothing seems make her condition better or worse Past Medical History  Diagnosis Date  . No pertinent past medical history   . Medical history non-contributory    Past Surgical History  Procedure Laterality Date  . Induced abortion     Family History  Problem Relation Age of Onset  . Other Neg Hx   . COPD Maternal Grandmother   . Diabetes Maternal Grandmother    Social History  Substance Use Topics  . Smoking status: Never Smoker   . Smokeless tobacco: Never Used  . Alcohol Use: No   OB History    Gravida Para Term Preterm AB TAB SAB Ectopic Multiple Living   4 1 1  3 3    1      Review of Systems  All other systems negative except as documented in the HPI. All pertinent positives and negatives as reviewed in the HPI.   Allergies  Review of patient's allergies indicates no known allergies.  Home Medications   Prior to Admission medications   Medication Sig Start Date End Date Taking? Authorizing Provider  etonogestrel (IMPLANON) 68 MG IMPL implant 1 each by Subdermal route continuous.   Yes Historical Provider, MD    BP 125/91 mmHg  Pulse 66  Temp(Src) 98.2 F (36.8 C) (Oral)  Resp 18  SpO2 100%  LMP 01/06/2015 Physical Exam  Constitutional: She is oriented to person, place, and time. She appears well-developed and well-nourished. No distress.  HENT:  Head: Normocephalic and atraumatic.  Mouth/Throat: Oropharynx is clear and moist.  Eyes: Pupils are equal, round, and reactive to light.  Neck: Normal range of motion. Neck supple.  Cardiovascular: Normal rate, regular rhythm and normal heart sounds.  Exam reveals no gallop and no friction rub.   No murmur heard. Pulmonary/Chest: Effort normal and breath sounds normal. No respiratory distress. She has no wheezes.  Abdominal: Soft. Normal appearance and bowel sounds are normal. She exhibits no distension. There is no tenderness.    Neurological: She is alert and oriented to person, place, and time. She exhibits normal muscle tone. Coordination normal.  Skin: Skin is warm and dry. No rash noted. No erythema.  Psychiatric: She has a normal mood and affect. Her behavior is normal.  Nursing note and vitals reviewed.   ED Course  Procedures (including critical care time) Labs Review Labs Reviewed  URINALYSIS, ROUTINE W REFLEX MICROSCOPIC (NOT AT [facility]) - Abnormal; Notable for the following:    APPearance TURBID (*)    Hgb urine dipstick TRACE (*)    All other components within normal limits  COMPREHENSIVE METABOLIC PANEL - Abnormal; Notable for the following:    Glucose,  Bld 101 (*)    Total Protein 6.4 (*)    ALT 13 (*)    All other components within normal limits  URINE MICROSCOPIC-ADD ON - Abnormal; Notable for the following:    Squamous Epithelial / LPF 0-5 (*)    All other components within normal limits  PREGNANCY, URINE  CBC WITH DIFFERENTIAL/PLATELET    Imaging Review No results found. I have personally reviewed and evaluated these images and lab results as part of my medical decision-making.    Patient will be treated for  her crampy pain and have her followed up with GYN in her primary doctor, told to return here as needed.  No imaging was needed at this time based on her symptoms and examination, patient is given IV fluids and pain control here in the emergency department.  I feel that this crampy pain is related to her period   Charlestine Night, PA-C 01/09/15 1114  Pricilla Loveless, MD 01/09/15 1158

## 2015-01-27 ENCOUNTER — Telehealth: Payer: Self-pay | Admitting: *Deleted

## 2015-01-27 NOTE — Telephone Encounter (Signed)
Pt called to office regarding PA on Rx.  Attempt to call pt. No answer, LM on VM making pt aware that PA has been submitted and approved. Advised to f/u at pharmacy to rerun Rx to verify approval.

## 2015-03-19 ENCOUNTER — Ambulatory Visit: Payer: Medicaid Other | Admitting: Certified Nurse Midwife

## 2015-05-05 ENCOUNTER — Ambulatory Visit: Payer: Medicaid Other | Admitting: Certified Nurse Midwife

## 2015-07-05 ENCOUNTER — Ambulatory Visit: Payer: Medicaid Other | Admitting: Podiatry

## 2015-11-29 ENCOUNTER — Ambulatory Visit: Payer: Self-pay | Admitting: Certified Nurse Midwife

## 2015-12-07 ENCOUNTER — Ambulatory Visit: Payer: Self-pay | Admitting: Certified Nurse Midwife

## 2015-12-17 ENCOUNTER — Encounter (HOSPITAL_COMMUNITY): Payer: Self-pay

## 2015-12-17 ENCOUNTER — Emergency Department (HOSPITAL_COMMUNITY)
Admission: EM | Admit: 2015-12-17 | Discharge: 2015-12-18 | Disposition: A | Discharge status: Home / Self Care | Payer: Medicaid Other | Attending: Emergency Medicine | Admitting: Emergency Medicine

## 2015-12-17 DIAGNOSIS — Y929 Unspecified place or not applicable: Secondary | ICD-10-CM | POA: Diagnosis not present

## 2015-12-17 DIAGNOSIS — S0083XA Contusion of other part of head, initial encounter: Secondary | ICD-10-CM

## 2015-12-17 DIAGNOSIS — Y939 Activity, unspecified: Secondary | ICD-10-CM | POA: Diagnosis not present

## 2015-12-17 DIAGNOSIS — S0232XA Fracture of orbital floor, left side, initial encounter for closed fracture: Secondary | ICD-10-CM | POA: Diagnosis not present

## 2015-12-17 DIAGNOSIS — Y999 Unspecified external cause status: Secondary | ICD-10-CM | POA: Insufficient documentation

## 2015-12-17 DIAGNOSIS — S0592XA Unspecified injury of left eye and orbit, initial encounter: Secondary | ICD-10-CM | POA: Diagnosis present

## 2015-12-17 DIAGNOSIS — H1132 Conjunctival hemorrhage, left eye: Secondary | ICD-10-CM | POA: Diagnosis not present

## 2015-12-17 MED ORDER — BACITRACIN ZINC 500 UNIT/GM EX OINT
TOPICAL_OINTMENT | Freq: Two times a day (BID) | CUTANEOUS | Status: DC
  Administered 2015-12-18: 1 via TOPICAL
  Filled 2015-12-17: qty 1.8

## 2015-12-17 MED ORDER — KETOROLAC TROMETHAMINE 60 MG/2ML IM SOLN
60.0000 mg | Freq: Once | INTRAMUSCULAR | Status: AC
  Administered 2015-12-17: 60 mg via INTRAMUSCULAR
  Filled 2015-12-17: qty 2
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
## 2015-12-18 ENCOUNTER — Encounter (HOSPITAL_COMMUNITY): Payer: Self-pay | Admitting: Emergency Medicine

## 2015-12-18 ENCOUNTER — Emergency Department (HOSPITAL_COMMUNITY): Payer: Medicaid Other

## 2015-12-18 ENCOUNTER — Telehealth (HOSPITAL_BASED_OUTPATIENT_CLINIC_OR_DEPARTMENT_OTHER): Payer: Self-pay

## 2015-12-18 MED ORDER — ONDANSETRON 8 MG PO TBDP
8.0000 mg | ORAL_TABLET | Freq: Once | ORAL | Status: AC
  Administered 2015-12-18: 8 mg via ORAL
  Filled 2015-12-18: qty 1

## 2015-12-18 MED ORDER — ERYTHROMYCIN 5 MG/GM OP OINT: TOPICAL_OINTMENT | OPHTHALMIC | 0 refills | Status: DC

## 2015-12-18 MED ORDER — AMOXICILLIN-POT CLAVULANATE 875-125 MG PO TABS
1.0000 | ORAL_TABLET | Freq: Once | ORAL | Status: AC
  Administered 2015-12-18: 1 via ORAL
  Filled 2015-12-18: qty 1

## 2015-12-18 MED ORDER — AMOXICILLIN-POT CLAVULANATE 875-125 MG PO TABS: 1.0000 | ORAL_TABLET | Freq: Two times a day (BID) | ORAL | 0 refills | Status: DC

## 2015-12-18 MED ORDER — DICLOFENAC SODIUM ER 100 MG PO TB24: 100.0000 mg | ORAL_TABLET | Freq: Every day | ORAL | 0 refills | Status: DC

## 2015-12-18 NOTE — ED Notes (Signed)
Visual acuity : both eyes- 20/50, Right eye- 20/40,  Left eye-20/200

## 2015-12-18 NOTE — ED Notes (Signed)
[employer] at bedside 

## 2015-12-23 ENCOUNTER — Ambulatory Visit: Payer: Medicaid Other | Admitting: Certified Nurse Midwife

## 2016-02-14 NOTE — L&D Delivery Note (Signed)
  Orlie Dakinutt, BoyA Latangela [[id number]][[id number]]  Delivery Note At 3:29 AM a viable female was delivered via Vaginal, Spontaneous (Presentation:ROA).  APGAR: 9, 9; weight 5 lb 8.4 oz (2506 g).   Placenta status: Delivered intact with gentle traction. 3 vessels cord:  with the following complications: None . Cord pH: not collected  Anesthesia: Epidural Episiotomy: None Lacerations: Right labia majora tear Suture Repair: N/A Est. Blood Loss (mL): 300  Mom to postpartum.  Baby to Couplet care / Skin to Skin.  Lovena NeighboursAbdoulaye Berwyn Bigley, MD 01/06/2017, 4:08 AM     Linford Arnoldutt, BoyB Adilenne [[id number]][[id number]]  Delivery Note At 3:51 AM a viable female was delivered via Vaginal, Spontaneous (Presentation: OP).  APGAR: 9,9; weight pending.   Placenta status: Delivered intact with gentle traction.  Cord: 3 vessels cord with the following complications: None.  Cord pH: Not collected  Anesthesia: Epidural Episiotomy: None Lacerations: right labia majora tear  Suture Repair: N/A Est. Blood Loss (mL): 300  Mom to postpartum.  Baby to Couplet care / Skin to Skin.  Lovena NeighboursAbdoulaye Braiden Presutti, MD 01/06/2017, 4:08 AM

## 2016-03-09 ENCOUNTER — Ambulatory Visit (INDEPENDENT_AMBULATORY_CARE_PROVIDER_SITE_OTHER): Payer: Medicaid Other

## 2016-03-09 ENCOUNTER — Ambulatory Visit (HOSPITAL_COMMUNITY)
Admission: EM | Admit: 2016-03-09 | Discharge: 2016-03-09 | Disposition: A | Discharge status: Other Acute Inpatient Hospital | Payer: No Typology Code available for payment source

## 2016-03-09 ENCOUNTER — Emergency Department (HOSPITAL_COMMUNITY)
Admission: EM | Admit: 2016-03-09 | Discharge: 2016-03-09 | Disposition: A | Discharge status: Home / Self Care | Payer: Medicaid Other | Attending: Dermatology | Admitting: Dermatology

## 2016-03-09 ENCOUNTER — Ambulatory Visit (HOSPITAL_COMMUNITY)
Admission: EM | Admit: 2016-03-09 | Discharge: 2016-03-09 | Disposition: A | Discharge status: Home / Self Care | Payer: Medicaid Other | Attending: Emergency Medicine | Admitting: Emergency Medicine

## 2016-03-09 ENCOUNTER — Encounter (HOSPITAL_COMMUNITY): Payer: Self-pay | Admitting: Emergency Medicine

## 2016-03-09 DIAGNOSIS — S52124A Nondisplaced fracture of head of right radius, initial encounter for closed fracture: Secondary | ICD-10-CM | POA: Diagnosis not present

## 2016-03-09 DIAGNOSIS — M25521 Pain in right elbow: Secondary | ICD-10-CM | POA: Insufficient documentation

## 2016-03-09 DIAGNOSIS — Z5321 Procedure and treatment not carried out due to patient leaving prior to being seen by health care provider: Secondary | ICD-10-CM | POA: Insufficient documentation

## 2016-03-09 MED ORDER — HYDROCODONE-ACETAMINOPHEN 5-325 MG PO TABS
ORAL_TABLET | ORAL | Status: AC
  Filled 2016-03-09: qty 1

## 2016-03-09 MED ORDER — HYDROCODONE-ACETAMINOPHEN 5-325 MG PO TABS
1.0000 | ORAL_TABLET | Freq: Once | ORAL | Status: AC
  Administered 2016-03-09: 1 via ORAL

## 2016-03-09 MED ORDER — HYDROCODONE-ACETAMINOPHEN 5-325 MG PO TABS: 1.0000 | ORAL_TABLET | Freq: Four times a day (QID) | ORAL | 0 refills | Status: DC | PRN
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
## 2016-03-09 NOTE — Discharge Instructions (Signed)
It looks like you have a small fracture in your elbow. Take Tylenol or ibuprofen as needed for pain. You can apply ice as well. Use the hydrocodone every 4-6 hours as needed for severe pain. Do not drive while taking this medicine. Leave the splint on and keep it dry until you see Dr. Merlyn LotKuzma. Follow-up with Dr. Merlyn LotKuzma, the hand specialist.  Call his office first thing in the morning.

## 2016-03-09 NOTE — ED Triage Notes (Signed)
Patient reports punching a wall today, connected with right hand to wall, but felt something in right elbow.  Able to move fingers, but numbness and tingling in fingers.

## 2016-03-09 NOTE — ED Triage Notes (Signed)
Called x 2 no answer

## 2016-03-30 ENCOUNTER — Ambulatory Visit (INDEPENDENT_AMBULATORY_CARE_PROVIDER_SITE_OTHER): Payer: Medicaid Other | Admitting: Obstetrics & Gynecology

## 2016-03-30 ENCOUNTER — Encounter: Payer: Self-pay | Admitting: Obstetrics & Gynecology

## 2016-03-30 VITALS — BP 140/89 | HR 84 | Ht 65.0 in | Wt 181.0 lb

## 2016-03-30 DIAGNOSIS — Z3049 Encounter for surveillance of other contraceptives: Secondary | ICD-10-CM

## 2016-03-30 DIAGNOSIS — Z30011 Encounter for initial prescription of contraceptive pills: Secondary | ICD-10-CM

## 2016-03-30 DIAGNOSIS — Z3046 Encounter for surveillance of implantable subdermal contraceptive: Secondary | ICD-10-CM

## 2016-03-30 MED ORDER — NORGESTIM-ETH ESTRAD TRIPHASIC 0.18/0.215/0.25 MG-35 MCG PO TABS: 1.0000 | ORAL_TABLET | Freq: Every day | ORAL | 11 refills | Status: DC

## 2016-03-30 NOTE — Patient Instructions (Signed)
Oral Contraception Use Oral contraceptive pills (OCPs) are medicines taken to prevent pregnancy. OCPs work by preventing the ovaries from releasing eggs. The hormones in OCPs also cause the cervical mucus to thicken, preventing the sperm from entering the uterus. The hormones also cause the uterine lining to become thin, not allowing a fertilized egg to attach to the inside of the uterus. OCPs are highly effective when taken exactly as prescribed. However, OCPs do not prevent sexually transmitted diseases (STDs). Safe sex practices, such as using condoms along with an OCP, can help prevent STDs. Before taking OCPs, you may have a physical exam and Pap test. Your health care provider may also order blood tests if necessary. Your health care provider will make sure you are a good candidate for oral contraception. Discuss with your health care provider the possible side effects of the OCP you may be prescribed. When starting an OCP, it can take 2 to 3 months for the body to adjust to the changes in hormone levels in your body. How to take oral contraceptive pills Your health care provider may advise you on how to start taking the first cycle of OCPs. Otherwise, you can:  Start on day 1 of your menstrual period. You will not need any backup contraceptive protection with this start time.  Start on the first Sunday after your menstrual period or the day you get your prescription. In these cases, you will need to use backup contraceptive protection for the first week.  Start the pill at any time of your cycle. If you take the pill within 5 days of the start of your period, you are protected against pregnancy right away. In this case, you will not need a backup form of birth control. If you start at any other time of your menstrual cycle, you will need to use another form of birth control for 7 days. If your OCP is the type called a minipill, it will protect you from pregnancy after taking it for 2 days (48  hours). After you have started taking OCPs:  If you forget to take 1 pill, take it as soon as you remember. Take the next pill at the regular time.  If you miss 2 or more pills, call your health care provider because different pills have different instructions for missed doses. Use backup birth control until your next menstrual period starts.  If you use a 28-day pack that contains inactive pills and you miss 1 of the last 7 pills (pills with no hormones), it will not matter. Throw away the rest of the non-hormone pills and start a new pill pack. No matter which day you start the OCP, you will always start a new pack on that same day of the week. Have an extra pack of OCPs and a backup contraceptive method available in case you miss some pills or lose your OCP pack. Follow these instructions at home:  Do not smoke.  Always use a condom to protect against STDs. OCPs do not protect against STDs.  Use a calendar to mark your menstrual period days.  Read the information and directions that came with your OCP. Talk to your health care provider if you have questions. Contact a health care provider if:  You develop nausea and vomiting.  You have abnormal vaginal discharge or bleeding.  You develop a rash.  You miss your menstrual period.  You are losing your hair.  You need treatment for mood swings or depression.  You get dizzy  when taking the OCP.  You develop acne from taking the OCP.  You become pregnant. Get help right away if:  You develop chest pain.  You develop shortness of breath.  You have an uncontrolled or severe headache.  You develop numbness or slurred speech.  You develop visual problems.  You develop pain, redness, and swelling in the legs. This information is not intended to replace advice given to you by your health care provider. Make sure you discuss any questions you have with your health care provider. Document Released: 01/19/2011 Document Revised:  07/08/2015 Document Reviewed: 07/21/2012 Elsevier Interactive Patient Education  2017 Elsevier Inc. Etonogestrel implant What is this medicine? ETONOGESTREL (et oh noe JES trel) is a contraceptive (birth control) device. It is used to prevent pregnancy. It can be used for up to 3 years. COMMON BRAND NAME(S): Implanon, Nexplanon What should I tell my health care provider before I take this medicine? They need to know if you have any of these conditions: -abnormal vaginal bleeding -blood vessel disease or blood clots -cancer of the breast, cervix, or liver -depression -diabetes -gallbladder disease -headaches -heart disease or recent heart attack -high blood pressure -high cholesterol -kidney disease -liver disease -renal disease -seizures -tobacco smoker -an unusual or allergic reaction to etonogestrel, other hormones, anesthetics or antiseptics, medicines, foods, dyes, or preservatives -pregnant or trying to get pregnant -breast-feeding How should I use this medicine? This device is inserted just under the skin on the inner side of your upper arm by a health care professional. Talk to your pediatrician regarding the use of this medicine in children. Special care may be needed. What if I miss a dose? This does not apply. What may interact with this medicine? Do not take this medicine with any of the following medications: -amprenavir -bosentan -fosamprenavir This medicine may also interact with the following medications: -barbiturate medicines for inducing sleep or treating seizures -certain medicines for fungal infections like ketoconazole and itraconazole -griseofulvin -medicines to treat seizures like carbamazepine, felbamate, oxcarbazepine, phenytoin, topiramate -modafinil -phenylbutazone -rifampin -some medicines to treat HIV infection like atazanavir, indinavir, lopinavir, nelfinavir, tipranavir, ritonavir -St. John's wort What should I watch for while using this  medicine? This product does not protect you against HIV infection (AIDS) or other sexually transmitted diseases. You should be able to feel the implant by pressing your fingertips over the skin where it was inserted. Contact your doctor if you cannot feel the implant, and use a non-hormonal birth control method (such as condoms) until your doctor confirms that the implant is in place. If you feel that the implant may have broken or become bent while in your arm, contact your healthcare provider. What side effects may I notice from receiving this medicine? Side effects that you should report to your doctor or health care professional as soon as possible: -allergic reactions like skin rash, itching or hives, swelling of the face, lips, or tongue -breast lumps -changes in emotions or moods -depressed mood -heavy or prolonged menstrual bleeding -pain, irritation, swelling, or bruising at the insertion site -scar at site of insertion -signs of infection at the insertion site such as fever, and skin redness, pain or discharge -signs of pregnancy -signs and symptoms of a blood clot such as breathing problems; changes in vision; chest pain; severe, sudden headache; pain, swelling, warmth in the leg; trouble speaking; sudden numbness or weakness of the face, arm or leg -signs and symptoms of liver injury like dark yellow or brown urine; general ill feeling  or flu-like symptoms; light-colored stools; loss of appetite; nausea; right upper belly pain; unusually weak or tired; yellowing of the eyes or skin -unusual vaginal bleeding, discharge -signs and symptoms of a stroke like changes in vision; confusion; trouble speaking or understanding; severe headaches; sudden numbness or weakness of the face, arm or leg; trouble walking; dizziness; loss of balance or coordination Side effects that usually do not require medical attention (report to your doctor or health care professional if they continue or are  bothersome): -acne -back pain -breast pain -changes in weight -dizziness -general ill feeling or flu-like symptoms -headache -irregular menstrual bleeding -nausea -sore throat -vaginal irritation or inflammation Where should I keep my medicine? This drug is given in a hospital or clinic and will not be stored at home.  2017 Elsevier/Gold Standard (2015-03-04 10:56:20)

## 2016-03-30 NOTE — Progress Notes (Signed)
GYNECOLOGY OFFICE PROCEDURE NOTE  Kathleen Esparza is a 26 y.o. [phone] here for Nexplanon removal.  Last pap smear was on 10/2014 and was normal.  No other gynecologic concerns. She requests a prescription for oral contraceptives  Nexplanon Removal Patient identified, informed consent performed, consent signed.   Appropriate time out taken. Nexplanon site identified.  Area prepped in usual sterile fashon. One ml of 1% lidocaine was used to anesthetize the area at the distal end of the implant. A small stab incision was made right beside the implant on the distal portion.  The Nexplanon rod was grasped using hemostats and removed without difficulty.  There was minimal blood loss. There were no complications.  Band-aid applied over the small incision.  A pressure bandage was applied to reduce any bruising.  The patient tolerated the procedure well and was given post procedure instructions.  Patient is planning to use oral contraceptive for contraception.      Adam PhenixJames G Jazel Nimmons, MD Attending Obstetrician & Gynecologist, [facility]

## 2016-03-30 NOTE — Progress Notes (Signed)
Pt present for Nexplanon removal. Nexplanon expires 04/07/16. Pt requests to start BCP.

## 2016-04-14 ENCOUNTER — Ambulatory Visit: Payer: Medicaid Other | Admitting: Obstetrics

## 2016-04-26 ENCOUNTER — Encounter (HOSPITAL_COMMUNITY): Payer: Self-pay | Admitting: *Deleted

## 2016-04-26 ENCOUNTER — Inpatient Hospital Stay (HOSPITAL_COMMUNITY)
Admission: AD | Admit: 2016-04-26 | Discharge: 2016-04-26 | Discharge status: Against Medical Advice | Payer: Medicaid Other | Source: Ambulatory Visit | Attending: Obstetrics & Gynecology | Admitting: Obstetrics & Gynecology

## 2016-04-26 DIAGNOSIS — R109 Unspecified abdominal pain: Secondary | ICD-10-CM | POA: Diagnosis not present

## 2016-04-26 LAB — URINALYSIS, ROUTINE W REFLEX MICROSCOPIC
Bilirubin Urine: NEGATIVE
GLUCOSE, UA: NEGATIVE mg/dL
HGB URINE DIPSTICK: NEGATIVE
Ketones, ur: NEGATIVE mg/dL
LEUKOCYTES UA: NEGATIVE
Nitrite: NEGATIVE
PH: 7 (ref 5.0–8.0)
Protein, ur: NEGATIVE mg/dL
Specific Gravity, Urine: 1.026 (ref 1.005–1.030)

## 2016-04-26 LAB — POCT PREGNANCY, URINE: Preg Test, Ur: NEGATIVE

## 2016-04-26 MED ORDER — METHYLPREDNISOLONE SODIUM SUCC 125 MG IJ SOLR: 60.0000 mg | Freq: Once | INTRAMUSCULAR | Status: DC

## 2016-04-26 MED ORDER — IPRATROPIUM-ALBUTEROL 0.5-2.5 (3) MG/3ML IN SOLN: 3.0000 mL | Freq: Once | RESPIRATORY_TRACT | Status: DC

## 2016-04-26 NOTE — MAU Note (Signed)
Not in lobby x2.

## 2016-04-26 NOTE — MAU Note (Signed)
Not in lobby

## 2016-04-26 NOTE — MAU Note (Signed)
Pt C/O lower abd cramping since last night, started bleeding last night, has decreased to spotting today.  Has vomited once today.  Recently had nexplanon taken out, is on birth control pills now.

## 2016-04-26 NOTE — MAU Note (Signed)
Not in lobby x 3  

## 2016-05-29 ENCOUNTER — Encounter (HOSPITAL_COMMUNITY): Payer: Self-pay | Admitting: Emergency Medicine

## 2016-05-29 ENCOUNTER — Inpatient Hospital Stay (HOSPITAL_COMMUNITY)
Admission: AD | Admit: 2016-05-29 | Discharge: 2016-05-29 | Discharge status: Against Medical Advice | Payer: Medicaid Other | Source: Ambulatory Visit | Attending: Obstetrics and Gynecology | Admitting: Obstetrics and Gynecology

## 2016-05-29 ENCOUNTER — Encounter (HOSPITAL_COMMUNITY): Payer: Self-pay | Admitting: *Deleted

## 2016-05-29 ENCOUNTER — Emergency Department (HOSPITAL_COMMUNITY)
Admission: EM | Admit: 2016-05-29 | Discharge: 2016-05-29 | Disposition: A | Discharge status: Home / Self Care | Payer: Medicaid Other | Attending: Emergency Medicine | Admitting: Emergency Medicine

## 2016-05-29 DIAGNOSIS — Z5321 Procedure and treatment not carried out due to patient leaving prior to being seen by health care provider: Secondary | ICD-10-CM | POA: Diagnosis not present

## 2016-05-29 DIAGNOSIS — N939 Abnormal uterine and vaginal bleeding, unspecified: Secondary | ICD-10-CM | POA: Insufficient documentation

## 2016-05-29 LAB — POCT PREGNANCY, URINE: PREG TEST UR: POSITIVE — AB

## 2016-05-29 LAB — URINALYSIS, ROUTINE W REFLEX MICROSCOPIC
Glucose, UA: NEGATIVE mg/dL
Ketones, ur: >80 mg/dL — AB
Leukocytes, UA: NEGATIVE
NITRITE: NEGATIVE
PROTEIN: NEGATIVE mg/dL
Specific Gravity, Urine: 1.025 (ref 1.005–1.030)
pH: 6 (ref 5.0–8.0)

## 2016-05-29 LAB — URINALYSIS, MICROSCOPIC (REFLEX)
Bacteria, UA: NONE SEEN
WBC, UA: NONE SEEN WBC/hpf (ref 0–5)

## 2016-05-29 NOTE — ED Triage Notes (Addendum)
Pt reports she began to have vaginal bleeding and abd pain since this am. Kathleen Esparza to [facility], but came here due to wait times. Pt reports she had a syncopal episode in the lobby. Pt unsure if she is pregnant.

## 2016-05-29 NOTE — MAU Note (Signed)
Call from [facility], pt is now there

## 2016-05-29 NOTE — MAU Note (Signed)
Adm clerk reported, pt just left

## 2016-05-29 NOTE — MAU Note (Signed)
+  HPT  About a wk ago.  Hasn't been able to eat the past few days, nauseated, can't keep anything down.  Started bleeding yesterday. Cramping In lower abd

## 2016-05-29 NOTE — ED Notes (Signed)
pts mother stated "she was going to go home and she get worse she would come back."

## 2016-06-05 ENCOUNTER — Inpatient Hospital Stay (HOSPITAL_COMMUNITY)
Admission: AD | Admit: 2016-06-05 | Discharge: 2016-06-05 | Disposition: A | Discharge status: Home / Self Care | Payer: Medicaid Other | Source: Ambulatory Visit | Attending: Obstetrics & Gynecology | Admitting: Obstetrics & Gynecology

## 2016-06-05 ENCOUNTER — Encounter (HOSPITAL_COMMUNITY): Payer: Self-pay | Admitting: *Deleted

## 2016-06-05 ENCOUNTER — Inpatient Hospital Stay (HOSPITAL_COMMUNITY): Payer: Medicaid Other

## 2016-06-05 DIAGNOSIS — O26891 Other specified pregnancy related conditions, first trimester: Secondary | ICD-10-CM | POA: Insufficient documentation

## 2016-06-05 DIAGNOSIS — Z3A01 Less than 8 weeks gestation of pregnancy: Secondary | ICD-10-CM | POA: Diagnosis not present

## 2016-06-05 DIAGNOSIS — O30041 Twin pregnancy, dichorionic/diamniotic, first trimester: Secondary | ICD-10-CM | POA: Insufficient documentation

## 2016-06-05 DIAGNOSIS — O26899 Other specified pregnancy related conditions, unspecified trimester: Secondary | ICD-10-CM

## 2016-06-05 DIAGNOSIS — O208 Other hemorrhage in early pregnancy: Secondary | ICD-10-CM | POA: Diagnosis present

## 2016-06-05 DIAGNOSIS — R109 Unspecified abdominal pain: Secondary | ICD-10-CM | POA: Diagnosis not present

## 2016-06-05 DIAGNOSIS — O468X1 Other antepartum hemorrhage, first trimester: Secondary | ICD-10-CM | POA: Diagnosis not present

## 2016-06-05 DIAGNOSIS — O418X10 Other specified disorders of amniotic fluid and membranes, first trimester, not applicable or unspecified: Secondary | ICD-10-CM

## 2016-06-05 DIAGNOSIS — O209 Hemorrhage in early pregnancy, unspecified: Secondary | ICD-10-CM

## 2016-06-05 LAB — CBC
HCT: 34.4 % — ABNORMAL LOW (ref 36.0–46.0)
Hemoglobin: 12.5 g/dL (ref 12.0–15.0)
MCH: 32 pg (ref 26.0–34.0)
MCHC: 36.3 g/dL — AB (ref 30.0–36.0)
MCV: 88 fL (ref 78.0–100.0)
PLATELETS: 352 10*3/uL (ref 150–400)
RBC: 3.91 MIL/uL (ref 3.87–5.11)
RDW: 13 % (ref 11.5–15.5)
WBC: 12.2 10*3/uL — ABNORMAL HIGH (ref 4.0–10.5)

## 2016-06-05 LAB — URINALYSIS, MICROSCOPIC (REFLEX): SQUAMOUS EPITHELIAL / LPF: NONE SEEN

## 2016-06-05 LAB — URINALYSIS, ROUTINE W REFLEX MICROSCOPIC
PH: 8 (ref 5.0–8.0)
Specific Gravity, Urine: 1.015 (ref 1.005–1.030)

## 2016-06-05 LAB — HCG, QUANTITATIVE, PREGNANCY: HCG, BETA CHAIN, QUANT, S: 143202 m[IU]/mL — AB (ref <5)

## 2016-06-05 NOTE — MAU Provider Note (Signed)
History     CSN: [id number]  Arrival date and time: 06/05/16 0941  First Provider Initiated Contact with Patient 06/05/16 1028      Chief Complaint  Patient presents with  . Vaginal Bleeding  . Abdominal Pain   HPI  Kathleen Esparza is a 26 y.o. [id number] at [redacted]w[redacted]d by LMP who presents with vaginal bleeding. Reports some abdominal cramping and vaginal bleeding a few weeks ago that resolved. Current symptoms began this morning. Reports filling up 3 pads and passing several clots. Lower abdominal cramping & rectal pressure. Rates pain 8/10. Has not treated. Denies n/v/d, fever, dysuria, or recent intercourse.   OB History    Gravida Para Term Preterm AB Living   SAB TAB Ectopic Multiple Live Births     2     1      Past Medical History:  Diagnosis Date  . Medical history non-contributory     Past Surgical History:  Procedure Laterality Date  . INDUCED ABORTION      Family History  Problem Relation Age of Onset  . COPD Maternal Grandmother   . Diabetes Maternal Grandmother   . Other Neg Hx     Social History  Substance Use Topics  . Smoking status: Never Smoker  . Smokeless tobacco: Never Used  . Alcohol use No    Allergies: No Known Allergies  Facility-Administered Medications Prior to Admission  Medication Dose Route Frequency Provider Last Rate Last Dose  . etonogestrel (IMPLANON) implant 68 mg  68 mg Subcutaneous Once Brock Bad, MD       Prescriptions Prior to Admission  Medication Sig Dispense Refill Last Dose  . etonogestrel (IMPLANON) 68 MG IMPL implant 1 each by Subdermal route continuous.   Taking  . Norgestimate-Ethinyl Estradiol Triphasic (TRI-SPRINTEC) 0.18/0.215/0.25 MG-35 MCG tablet Take 1 tablet by mouth daily. 1 Package 11     Review of Systems  Constitutional: Negative.   Gastrointestinal: Positive for abdominal pain and rectal pain. Negative for constipation, diarrhea, nausea and vomiting.  Genitourinary: Positive for  vaginal bleeding. Negative for dyspareunia, dysuria and vaginal discharge.   Physical Exam   Blood pressure 117/79, pulse 81, temperature 98.4 F (36.9 C), temperature source Oral, resp. rate 18, height  (1.651 m), weight 177 lb 12 oz (80.6 kg), last menstrual period 03/24/2016, SpO2 100 %.  Physical Exam  Nursing note and vitals reviewed. Constitutional: She is oriented to person, place, and time. She appears well-developed and well-nourished. No distress.  HENT:  Head: Normocephalic and atraumatic.  Eyes: Conjunctivae are normal. Right eye exhibits no discharge. Left eye exhibits no discharge. No scleral icterus.  Neck: Normal range of motion.  Respiratory: Effort normal. No respiratory distress.  GI: Soft. She exhibits no distension. There is no tenderness. There is no rebound and no guarding.  Genitourinary: Uterus is enlarged. Cervix exhibits no motion tenderness and no friability. There is bleeding (small amount of dark red mucoid blood; no active bleeding; cervic closed) in the vagina.  Neurological: She is alert and oriented to person, place, and time.  Skin: Skin is warm and dry. She is not diaphoretic.  Psychiatric: She has a normal mood and affect. Her behavior is normal. Judgment and thought content normal.    MAU Course  Procedures Results for orders placed or performed during the hospital encounter of 06/05/16 (from the past 24 hour(s))  Urinalysis, Routine w reflex microscopic     Status: Abnormal  Collection Time: 06/05/16  9:47 AM  Result Value Ref Range   Color, Urine RED (A) YELLOW   APPearance TURBID (A) CLEAR   Specific Gravity, Urine 1.015 1.005 - 1.030   pH 8.0 5.0 - 8.0   Glucose, UA (A) NEGATIVE mg/dL    TEST NOT REPORTED DUE TO COLOR INTERFERENCE OF URINE PIGMENT   Hgb urine dipstick (A) NEGATIVE    TEST NOT REPORTED DUE TO COLOR INTERFERENCE OF URINE PIGMENT   Bilirubin Urine (A) NEGATIVE    TEST NOT REPORTED DUE TO COLOR INTERFERENCE OF URINE  PIGMENT   Ketones, ur (A) NEGATIVE mg/dL    TEST NOT REPORTED DUE TO COLOR INTERFERENCE OF URINE PIGMENT   Protein, ur (A) NEGATIVE mg/dL    TEST NOT REPORTED DUE TO COLOR INTERFERENCE OF URINE PIGMENT   Nitrite (A) NEGATIVE    TEST NOT REPORTED DUE TO COLOR INTERFERENCE OF URINE PIGMENT   Leukocytes, UA (A) NEGATIVE    TEST NOT REPORTED DUE TO COLOR INTERFERENCE OF URINE PIGMENT  Urinalysis, Microscopic (reflex)     Status: Abnormal   Collection Time: 06/05/16  9:47 AM  Result Value Ref Range   RBC / HPF TOO NUMEROUS TO COUNT 0 - 5 RBC/hpf   WBC, UA 0-5 0 - 5 WBC/hpf   Bacteria, UA RARE (A) NONE SEEN   Squamous Epithelial / LPF NONE SEEN NONE SEEN  CBC     Status: Abnormal   Collection Time: 06/05/16 10:15 AM  Result Value Ref Range   WBC 12.2 (H) 4.0 - 10.5 K/uL   RBC 3.91 3.87 - 5.11 MIL/uL   Hemoglobin 12.5 12.0 - 15.0 g/dL   HCT 29.5 (L) 62.1 - 30.8 %   MCV 88.0 78.0 - 100.0 fL   MCH 32.0 26.0 - 34.0 pg   MCHC 36.3 (H) 30.0 - 36.0 g/dL   RDW 65.7 84.6 - 96.2 %   Platelets 352 150 - 400 K/uL  hCG, quantitative, pregnancy     Status: Abnormal   Collection Time: 06/05/16 10:15 AM  Result Value Ref Range   hCG, Beta Chain, Quant, S 143,202 (H) <5 mIU/mL   US Ob Comp Less 14 Wks  Result Date: 06/05/2016 CLINICAL DATA:  26 year old pregnant female presenting with vaginal bleeding and abdominopelvic pain/cramping. EDC by LMP: 01/04/2017, projecting to an expected gestational age of [redacted] weeks 4 days. EXAM: TWIN OBSTETRIC <14WK [place] AND TRANSVAGINAL OB US COMPARISON:  No prior scans from this gestation. FINDINGS: Number of IUPs:  2 Chorionicity/Amnionicity: Dichorionic-diamniotic (thick intervening membrane and two yolk sacs) TWIN A (maternal left side) Yolk sac:  Visualized. Embryo:  Visualized. Embryonic Cardiac Activity: Regular rate and rhythm. Embryonic Heart Rate: 128 bpm CRL:  9.3  mm   6 w 6 d                  [place] EDC: 01/23/2017 TWIN B (maternal right side) Yolk sac:  Visualized.  Embryo:  Visualized. Embryonic Cardiac Activity: Regular rate and rhythm. Embryonic Heart Rate: 130 bpm CRL:  9.9  mm   7 w 0 d                  [place] EDC: 01/22/2017 Subchorionic hemorrhage: Small perigestational bleed in the lower uterine cavity measuring 3.1 x 1.6 x 2.6 cm. Maternal uterus/adnexae: Retroverted retroflexed uterus. No uterine fibroids. Right ovary measures 5.0 x 2.2 x 3.5 cm and contains a corpus luteum. Left ovary measures 3.7 x 2.8 x 3.4 cm and  contains a corpus luteum. No abnormal ovarian or adnexal masses. No abnormal free fluid in the pelvis. IMPRESSION: 1. Living dichorionic diamniotic twin intrauterine gestations at 6 weeks 6 days and 7 weeks 0 days by crown-rump length, respectively. No significant embryonic size discrepancy. Normal embryonic cardiac activity in both gestations. 2. Small perigestational bleed in the lower uterine cavity. 3. No abnormal ovarian or adnexal findings. Electronically Signed   By: Delbert Phenix M.D.   On: 06/05/2016 11:40   US Ob Comp Addl Gest Less 14 Wks  Result Date: 06/05/2016 CLINICAL DATA:  26 year old pregnant female presenting with vaginal bleeding and abdominopelvic pain/cramping. EDC by LMP: 01/04/2017, projecting to an expected gestational age of [redacted] weeks 4 days. EXAM: TWIN OBSTETRIC <14WK [place] AND TRANSVAGINAL OB US COMPARISON:  No prior scans from this gestation. FINDINGS: Number of IUPs:  2 Chorionicity/Amnionicity: Dichorionic-diamniotic (thick intervening membrane and two yolk sacs) TWIN A (maternal left side) Yolk sac:  Visualized. Embryo:  Visualized. Embryonic Cardiac Activity: Regular rate and rhythm. Embryonic Heart Rate: 128 bpm CRL:  9.3  mm   6 w 6 d                  [place] EDC: 01/23/2017 TWIN B (maternal right side) Yolk sac:  Visualized. Embryo:  Visualized. Embryonic Cardiac Activity: Regular rate and rhythm. Embryonic Heart Rate: 130 bpm CRL:  9.9  mm   7 w 0 d                  [place] EDC: 01/22/2017 Subchorionic hemorrhage: Small  perigestational bleed in the lower uterine cavity measuring 3.1 x 1.6 x 2.6 cm. Maternal uterus/adnexae: Retroverted retroflexed uterus. No uterine fibroids. Right ovary measures 5.0 x 2.2 x 3.5 cm and contains a corpus luteum. Left ovary measures 3.7 x 2.8 x 3.4 cm and contains a corpus luteum. No abnormal ovarian or adnexal masses. No abnormal free fluid in the pelvis. IMPRESSION: 1. Living dichorionic diamniotic twin intrauterine gestations at 6 weeks 6 days and 7 weeks 0 days by crown-rump length, respectively. No significant embryonic size discrepancy. Normal embryonic cardiac activity in both gestations. 2. Small perigestational bleed in the lower uterine cavity. 3. No abnormal ovarian or adnexal findings. Electronically Signed   By: Delbert Phenix M.D.   On: 06/05/2016 11:40   US Ob Transvaginal  Result Date: 06/05/2016 CLINICAL DATA:  26 year old pregnant female presenting with vaginal bleeding and abdominopelvic pain/cramping. EDC by LMP: 01/04/2017, projecting to an expected gestational age of [redacted] weeks 4 days. EXAM: TWIN OBSTETRIC <14WK [place] AND TRANSVAGINAL OB US COMPARISON:  No prior scans from this gestation. FINDINGS: Number of IUPs:  2 Chorionicity/Amnionicity: Dichorionic-diamniotic (thick intervening membrane and two yolk sacs) TWIN A (maternal left side) Yolk sac:  Visualized. Embryo:  Visualized. Embryonic Cardiac Activity: Regular rate and rhythm. Embryonic Heart Rate: 128 bpm CRL:  9.3  mm   6 w 6 d                  [place] EDC: 01/23/2017 TWIN B (maternal right side) Yolk sac:  Visualized. Embryo:  Visualized. Embryonic Cardiac Activity: Regular rate and rhythm. Embryonic Heart Rate: 130 bpm CRL:  9.9  mm   7 w 0 d                  [place] EDC: 01/22/2017 Subchorionic hemorrhage: Small perigestational bleed in the lower uterine cavity measuring 3.1 x 1.6 x 2.6 cm. Maternal uterus/adnexae: Retroverted retroflexed uterus. No uterine fibroids.  Right ovary measures 5.0 x 2.2 x 3.5 cm and contains a corpus  luteum. Left ovary measures 3.7 x 2.8 x 3.4 cm and contains a corpus luteum. No abnormal ovarian or adnexal masses. No abnormal free fluid in the pelvis. IMPRESSION: 1. Living dichorionic diamniotic twin intrauterine gestations at 6 weeks 6 days and 7 weeks 0 days by crown-rump length, respectively. No significant embryonic size discrepancy. Normal embryonic cardiac activity in both gestations. 2. Small perigestational bleed in the lower uterine cavity. 3. No abnormal ovarian or adnexal findings. Electronically Signed   By: Delbert Phenix M.D.   On: 06/05/2016 11:40    MDM +UPT UA, CBC, ABO/Rh, quant hCG, HIV, and [place] today to rule out ectopic pregnancy O positive Minimal bleeding on exam & cervix closed Ultrasound shows Twin IUP @ [redacted]w[redacted]d with cardiac activity & small SCH Assessment and Plan  A: 1. Subchorionic hematoma in first trimester, single or unspecified fetus   2. Vaginal bleeding in pregnancy, first trimester   3. Abdominal pain affecting pregnancy   4. Dichorionic diamniotic twin pregnancy in first trimester    P: Discharge home Pelvic rest Start prenatal care Discussed reasons to return to MAU  Judeth Horn 06/05/2016, 10:13 AM

## 2016-06-05 NOTE — MAU Note (Signed)
When she got up this morning, had a whole bunch and a golf ball sized clot came out.  Had some bleeding a few wks ago,. But it had stopped.  Cramping in abd and butt.

## 2016-06-05 NOTE — Discharge Instructions (Signed)
Subchorionic Hematoma °A subchorionic hematoma is a gathering of blood between the outer wall of the placenta and the inner wall of the womb (uterus). The placenta is the organ that connects the fetus to the wall of the uterus. The placenta performs the feeding, breathing (oxygen to the fetus), and waste removal (excretory work) of the fetus. °Subchorionic hematoma is the most common abnormality found on a result from ultrasonography done during the first trimester or early second trimester of pregnancy. If there has been little or no vaginal bleeding, early small hematomas usually shrink on their own and do not affect your baby or pregnancy. The blood is gradually absorbed over 1-2 weeks. When bleeding starts later in pregnancy or the hematoma is larger or occurs in an older pregnant woman, the outcome may not be as good. Larger hematomas may get bigger, which increases the chances for miscarriage. Subchorionic hematoma also increases the risk of premature detachment of the placenta from the uterus, preterm (premature) labor, and stillbirth. °Follow these instructions at home: °· Stay on bed rest if your health care provider recommends this. Although bed rest will not prevent more bleeding or prevent a miscarriage, your health care provider may recommend bed rest until you are advised otherwise. °· Avoid heavy lifting (more than 10 lb [4.5 kg]), exercise, sexual intercourse, or douching as directed by your health care provider. °· Keep track of the number of pads you use each day and how soaked (saturated) they are. Write down this information. °· Do not use tampons. °· Keep all follow-up appointments as directed by your health care provider. Your health care provider may ask you to have follow-up blood tests or ultrasound tests or both. °Get help right away if: °· You have severe cramps in your stomach, back, abdomen, or pelvis. °· You have a fever. °· You pass large clots or tissue. Save any tissue for your  health care provider to look at. °· Your bleeding increases or you become lightheaded, feel weak, or have fainting episodes. °This information is not intended to replace advice given to you by your health care provider. Make sure you discuss any questions you have with your health care provider. °Document Released: 05/17/2006 Document Revised: 07/08/2015 Document Reviewed: 08/29/2012 °Elsevier Interactive Patient Education © 2017 [employer] ° °

## 2016-06-14 ENCOUNTER — Ambulatory Visit: Payer: Medicaid Other | Admitting: Obstetrics

## 2016-06-14 ENCOUNTER — Encounter: Payer: Self-pay | Admitting: *Deleted

## 2016-06-21 ENCOUNTER — Inpatient Hospital Stay (HOSPITAL_COMMUNITY)
Admission: AD | Admit: 2016-06-21 | Discharge: 2016-06-21 | Disposition: A | Discharge status: Home / Self Care | Payer: Medicaid Other | Source: Ambulatory Visit | Attending: Family Medicine | Admitting: Family Medicine

## 2016-06-21 ENCOUNTER — Encounter (HOSPITAL_COMMUNITY): Payer: Self-pay

## 2016-06-21 DIAGNOSIS — O209 Hemorrhage in early pregnancy, unspecified: Secondary | ICD-10-CM

## 2016-06-21 DIAGNOSIS — O418X10 Other specified disorders of amniotic fluid and membranes, first trimester, not applicable or unspecified: Secondary | ICD-10-CM

## 2016-06-21 DIAGNOSIS — Z3A09 9 weeks gestation of pregnancy: Secondary | ICD-10-CM | POA: Insufficient documentation

## 2016-06-21 DIAGNOSIS — O26899 Other specified pregnancy related conditions, unspecified trimester: Secondary | ICD-10-CM

## 2016-06-21 DIAGNOSIS — O26891 Other specified pregnancy related conditions, first trimester: Secondary | ICD-10-CM | POA: Insufficient documentation

## 2016-06-21 DIAGNOSIS — O208 Other hemorrhage in early pregnancy: Secondary | ICD-10-CM | POA: Insufficient documentation

## 2016-06-21 DIAGNOSIS — R109 Unspecified abdominal pain: Secondary | ICD-10-CM | POA: Diagnosis not present

## 2016-06-21 DIAGNOSIS — O468X1 Other antepartum hemorrhage, first trimester: Secondary | ICD-10-CM

## 2016-06-21 LAB — URINALYSIS, ROUTINE W REFLEX MICROSCOPIC
Bilirubin Urine: NEGATIVE
GLUCOSE, UA: NEGATIVE mg/dL
Ketones, ur: NEGATIVE mg/dL
Leukocytes, UA: NEGATIVE
Nitrite: NEGATIVE
PH: 8 (ref 5.0–8.0)
Protein, ur: NEGATIVE mg/dL
SPECIFIC GRAVITY, URINE: 1.016 (ref 1.005–1.030)
WBC, UA: NONE SEEN WBC/hpf (ref 0–5)

## 2016-06-21 NOTE — MAU Provider Note (Signed)
History     CSN: [id number]  Arrival date and time: 06/21/16 2130   First Provider Initiated Contact with Patient 06/21/16 [phone]      Chief Complaint  Patient presents with  . Vaginal Bleeding  . Abdominal Cramping   HPI   Ms.Kathleen Esparza is a 26 y.o. female G56P1021 @ [redacted]w[redacted]d with di/di twins, here in MAU with continued vaginal bleeding. She was here at the end of April with the same complaint and had an [place] that showed a subchorionic hemorrhage. She has continued to bleed since that visit. The bleeding comes and goes, at times she feels "gushes". Currently the bleeding is light and dark red. Patient wearing a pad during the day, changing her pad 3-4 times per day.   No recent intercourse.  Her New OB apppointment with Dr. Clearance Coots is 5/23  OB History    Gravida Para Term Preterm AB Living   4 1 1   2 1    SAB TAB Ectopic Multiple Live Births     2     1      Past Medical History:  Diagnosis Date  . Medical history non-contributory     Past Surgical History:  Procedure Laterality Date  . INDUCED ABORTION      Family History  Problem Relation Age of Onset  . COPD Maternal Grandmother   . Diabetes Maternal Grandmother   . Other Neg Hx     Social History  Substance Use Topics  . Smoking status: Never Smoker  . Smokeless tobacco: Never Used  . Alcohol use No    Allergies: No Known Allergies  No prescriptions prior to admission.   Results for orders placed or performed during the hospital encounter of 06/21/16 (from the past 24 hour(s))  Urinalysis, Routine w reflex microscopic     Status: Abnormal   Collection Time: 06/21/16  7:59 AM  Result Value Ref Range   Color, Urine YELLOW YELLOW   APPearance CLOUDY (A) CLEAR   Specific Gravity, Urine 1.016 1.005 - 1.030   pH 8.0 5.0 - 8.0   Glucose, UA NEGATIVE NEGATIVE mg/dL   Hgb urine dipstick SMALL (A) NEGATIVE   Bilirubin Urine NEGATIVE NEGATIVE   Ketones, ur NEGATIVE NEGATIVE mg/dL   Protein, ur NEGATIVE  NEGATIVE mg/dL   Nitrite NEGATIVE NEGATIVE   Leukocytes, UA NEGATIVE NEGATIVE   RBC / HPF 0-5 0 - 5 RBC/hpf   WBC, UA NONE SEEN 0 - 5 WBC/hpf   Bacteria, UA RARE (A) NONE SEEN   Squamous Epithelial / LPF 0-5 (A) NONE SEEN   Mucous PRESENT    Review of Systems  Constitutional: Negative for fever.  Gastrointestinal: Positive for abdominal pain.  Genitourinary: Negative for dysuria.  Musculoskeletal: Positive for back pain.   Physical Exam   Blood pressure 116/61, pulse 75, temperature 98.6 F (37 C), resp. rate 16, last menstrual period 03/24/2016, SpO2 99 %.  Physical Exam  Constitutional: She is oriented to person, place, and time. She appears well-developed and well-nourished. No distress.  HENT:  Head: Normocephalic.  Eyes: Pupils are equal, round, and reactive to light.  Respiratory: Effort normal.  GI: Soft. She exhibits no distension. There is no tenderness. There is no rebound.  Genitourinary:  Genitourinary Comments: Cervix closed, no blood noted on exam glove.   Musculoskeletal: Normal range of motion.  Neurological: She is alert and oriented to person, place, and time.  Skin: Skin is warm. She is not diaphoretic.  Psychiatric: Her behavior is  normal.   MAU Course  Procedures  None  MDM  O positive blood type  Patient talking on her cell phone throughout most of her visit in the MAU.  Patient upset that her Dr. Catalina Pizzaold her to come here today.  Bedside US shows active fetus X 2 with fetal heart rate. No blood noted on exam glove.   Assessment and Plan   A:  1. Vaginal bleeding in pregnancy, first trimester   2. Abdominal cramping affecting pregnancy   3. Subchorionic hematoma in first trimester, single or unspecified fetus     P:  Discharge home in stable condition  Discussed in detail reasons to return Discussed subchorionic hemorrhage in detail.  Keep your New OB appointment Pelvic rest    Rasch, Harolyn RutherfordJennifer I, NP 06/21/2016 2:40 PM

## 2016-06-21 NOTE — MAU Note (Addendum)
Pt has had continued bleeding since she was last seen. Changing 3 pads a day. Abdominal cramping 6/10

## 2016-06-21 NOTE — Discharge Instructions (Signed)
Subchorionic Hematoma °A subchorionic hematoma is a gathering of blood between the outer wall of the placenta and the inner wall of the womb (uterus). The placenta is the organ that connects the fetus to the wall of the uterus. The placenta performs the feeding, breathing (oxygen to the fetus), and waste removal (excretory work) of the fetus. °Subchorionic hematoma is the most common abnormality found on a result from ultrasonography done during the first trimester or early second trimester of pregnancy. If there has been little or no vaginal bleeding, early small hematomas usually shrink on their own and do not affect your baby or pregnancy. The blood is gradually absorbed over 1-2 weeks. When bleeding starts later in pregnancy or the hematoma is larger or occurs in an older pregnant woman, the outcome may not be as good. Larger hematomas may get bigger, which increases the chances for miscarriage. Subchorionic hematoma also increases the risk of premature detachment of the placenta from the uterus, preterm (premature) labor, and stillbirth. °Follow these instructions at home: °· Stay on bed rest if your health care provider recommends this. Although bed rest will not prevent more bleeding or prevent a miscarriage, your health care provider may recommend bed rest until you are advised otherwise. °· Avoid heavy lifting (more than 10 lb [4.5 kg]), exercise, sexual intercourse, or douching as directed by your health care provider. °· Keep track of the number of pads you use each day and how soaked (saturated) they are. Write down this information. °· Do not use tampons. °· Keep all follow-up appointments as directed by your health care provider. Your health care provider may ask you to have follow-up blood tests or ultrasound tests or both. °Get help right away if: °· You have severe cramps in your stomach, back, abdomen, or pelvis. °· You have a fever. °· You pass large clots or tissue. Save any tissue for your  health care provider to look at. °· Your bleeding increases or you become lightheaded, feel weak, or have fainting episodes. °This information is not intended to replace advice given to you by your health care provider. Make sure you discuss any questions you have with your health care provider. °Document Released: 05/17/2006 Document Revised: 07/08/2015 Document Reviewed: 08/29/2012 °Elsevier Interactive Patient Education © 2017 [employer] ° °

## 2016-07-05 ENCOUNTER — Other Ambulatory Visit (HOSPITAL_COMMUNITY)
Admission: RE | Admit: 2016-07-05 | Discharge: 2016-07-05 | Disposition: A | Discharge status: Home / Self Care | Payer: Medicaid Other | Source: Ambulatory Visit | Attending: Obstetrics | Admitting: Obstetrics

## 2016-07-05 ENCOUNTER — Encounter: Payer: Self-pay | Admitting: Obstetrics

## 2016-07-05 ENCOUNTER — Ambulatory Visit (INDEPENDENT_AMBULATORY_CARE_PROVIDER_SITE_OTHER): Payer: Medicaid Other | Admitting: Obstetrics

## 2016-07-05 VITALS — BP 126/80 | HR 96 | Wt 180.2 lb

## 2016-07-05 DIAGNOSIS — O30041 Twin pregnancy, dichorionic/diamniotic, first trimester: Secondary | ICD-10-CM

## 2016-07-05 DIAGNOSIS — Z3480 Encounter for supervision of other normal pregnancy, unspecified trimester: Secondary | ICD-10-CM | POA: Insufficient documentation

## 2016-07-05 DIAGNOSIS — O30049 Twin pregnancy, dichorionic/diamniotic, unspecified trimester: Secondary | ICD-10-CM

## 2016-07-05 DIAGNOSIS — Z3A11 11 weeks gestation of pregnancy: Secondary | ICD-10-CM | POA: Insufficient documentation

## 2016-07-05 HISTORY — DX: Encounter for supervision of other normal pregnancy, unspecified trimester: Z34.80

## 2016-07-05 LAB — POCT URINALYSIS DIPSTICK
BILIRUBIN UA: NEGATIVE
Glucose, UA: NEGATIVE
KETONES UA: NEGATIVE
LEUKOCYTES UA: NEGATIVE
NITRITE UA: NEGATIVE
PH UA: 6 (ref 5.0–8.0)
RBC UA: NEGATIVE
Spec Grav, UA: 1.025 (ref 1.010–1.025)
Urobilinogen, UA: 0.2 E.U./dL

## 2016-07-05 MED ORDER — CITRANATAL BLOOM DHA 90-1 & 300 MG PO MISC: 1.0000 | Freq: Every day | ORAL | 11 refills | Status: DC

## 2016-07-05 MED ORDER — FOLIC ACID 1 MG PO TABS: 1.0000 mg | ORAL_TABLET | Freq: Every day | ORAL | 11 refills | Status: DC

## 2016-07-05 NOTE — Progress Notes (Signed)
Subjective:    Kathleen Esparza is being seen today for her first obstetrical visit.  This is a planned pregnancy. She is at 1421w2d gestation. Her obstetrical history is significant for none. Relationship with FOB: supportive. Patient does intend to breast feed. Pregnancy history fully reviewed.  The information documented in the HPI was reviewed and verified.  Menstrual History: OB History    Gravida Para Term Preterm AB Living   4 1 1   2 1    SAB TAB Ectopic Multiple Live Births     2     1       Patient's last menstrual period was 03/24/2016 (approximate).    Past Medical History:  Diagnosis Date  . Medical history non-contributory     Past Surgical History:  Procedure Laterality Date  . INDUCED ABORTION    . TONSILLECTOMY  2016     (Not in a hospital admission) No Known Allergies  Social History  Substance Use Topics  . Smoking status: Former Smoker    Quit date: 2017  . Smokeless tobacco: Never Used  . Alcohol use No    Family History  Problem Relation Age of Onset  . COPD Maternal Grandmother   . Diabetes Maternal Grandmother   . Other Neg Hx      Review of Systems Constitutional: negative for weight loss Gastrointestinal: negative for vomiting Genitourinary:negative for genital lesions and vaginal discharge and dysuria Musculoskeletal:negative for back pain Behavioral/Psych: negative for abusive relationship, depression, illegal drug usage and tobacco use    Objective:    BP 126/80   Pulse 96   Wt 180 lb 3.2 oz (81.7 kg)   LMP 03/24/2016 (Approximate) Comment: implanon removed in Feb, never had a cycle  BMI 29.99 kg/m  General Appearance:    Alert, cooperative, no distress, appears stated age  Head:    Normocephalic, without obvious abnormality, atraumatic  Eyes:    PERRL, conjunctiva/corneas clear, EOM's intact, fundi    benign, both eyes  Ears:    Normal TM's and external ear canals, both ears  Nose:   Nares normal, septum midline, mucosa normal,  no drainage    or sinus tenderness  Throat:   Lips, mucosa, and tongue normal; teeth and gums normal  Neck:   Supple, symmetrical, trachea midline, no adenopathy;    thyroid:  no enlargement/tenderness/nodules; no carotid   bruit or JVD  Back:     Symmetric, no curvature, ROM normal, no CVA tenderness  Lungs:     Clear to auscultation bilaterally, respirations unlabored  Chest Wall:    No tenderness or deformity   Heart:    Regular rate and rhythm, S1 and S2 normal, no murmur, rub   or gallop  Breast Exam:    No tenderness, masses, or nipple abnormality  Abdomen:     Soft, non-tender, bowel sounds active all four quadrants,    no masses, no organomegaly  Genitalia:    Normal female without lesion, discharge or tenderness  Extremities:   Extremities normal, atraumatic, no cyanosis or edema  Pulses:   2+ and symmetric all extremities  Skin:   Skin color, texture, turgor normal, no rashes or lesions  Lymph nodes:   Cervical, supraclavicular, and axillary nodes normal  Neurologic:   CNII-XII intact, normal strength, sensation and reflexes    throughout      Lab Review Urine pregnancy test Labs reviewed yes Radiologic studies reviewed yes  Assessment:    Pregnancy at 4821w2d weeks  Plan:     1. Dichorionic diamniotic twin pregnancy, antepartum Rx: - Cytology - PAP - Cervicovaginal ancillary only - Culture, OB Urine - Hemoglobinopathy evaluation - Obstetric Panel, Including HIV - Vitamin D (25 hydroxy) - Prenat w/o A-FeCbGl-DSS-FA-DHA (CITRANATAL BLOOM DHA) 90-1 & 300 MG MISC; Take 1 tablet by mouth daily before breakfast.  Dispense: 60 each; Refill: 11 - folic acid (FOLVITE) 1 MG tablet; Take 1 tablet (1 mg total) by mouth daily.  Dispense: 30 tablet; Refill: 11 - [place] MFM OB DETAIL +14 WK; Future  Prenatal vitamins.  Counseling provided regarding continued use of seat belts, cessation of alcohol consumption, smoking or use of illicit drugs; infection precautions i.e.,  influenza/TDAP immunizations, toxoplasmosis,CMV, parvovirus, listeria and varicella; workplace safety, exercise during pregnancy; routine dental care, safe medications, sexual activity, hot tubs, saunas, pools, travel, caffeine use, fish and methlymercury, potential toxins, hair treatments, varicose veins Weight gain recommendations per IOM guidelines reviewed: underweight/BMI< 18.5--> gain 28 - 40 lbs; normal weight/BMI 18.5 - 24.9--> gain 25 - 35 lbs; overweight/BMI 25 - 29.9--> gain 15 - 25 lbs; obese/BMI >30->gain  11 - 20 lbs Problem list reviewed and updated. FIRST/CF mutation testing/NIPT/QUAD SCREEN/fragile X/Ashkenazi Jewish population testing/Spinal muscular atrophy discussed: requested. Role of ultrasound in pregnancy discussed; fetal survey: requested. Amniocentesis discussed: not indicated.  Meds ordered this encounter  Medications  . Prenat w/o A-FeCbGl-DSS-FA-DHA (CITRANATAL BLOOM DHA) 90-1 & 300 MG MISC    Sig: Take 1 tablet by mouth daily before breakfast.    Dispense:  60 each    Refill:  11  . folic acid (FOLVITE) 1 MG tablet    Sig: Take 1 tablet (1 mg total) by mouth daily.    Dispense:  30 tablet    Refill:  11   Orders Placed This Encounter  Procedures  . Culture, OB Urine  . [place] MFM OB DETAIL +14 WK    Standing Status:   Future    Standing Expiration Date:   09/04/2017    Scheduling Instructions:     Di-Di twins    Order Specific Question:   Reason for Exam (SYMPTOM  OR DIAGNOSIS REQUIRED)    Answer:   Anatomic Survey    Order Specific Question:   Preferred imaging location?    Answer:   [facility]-Ultrasound  . [place] MFM OB DETAIL ADDL GEST +14 WK    Standing Status:   Future    Standing Expiration Date:   09/04/2017    Order Specific Question:   Reason for Exam (SYMPTOM  OR DIAGNOSIS REQUIRED)    Answer:   Anatomic Survey    Order Specific Question:   Preferred imaging location?    Answer:   [facility]-Ultrasound  . Hemoglobinopathy evaluation  . Obstetric Panel, Including  HIV  . Vitamin D (25 hydroxy)    Follow up in 4 weeks. 50% of 20 min visit spent on counseling and coordination of care. Patient ID: Kathleen Esparza, female   DOB: [birth year], 26 y.o.   MRN: [id number]

## 2016-07-05 NOTE — Progress Notes (Signed)
Patient is in the office for initial ob visit, pt reports no complaints.

## 2016-07-06 LAB — CERVICOVAGINAL ANCILLARY ONLY
BACTERIAL VAGINITIS: POSITIVE — AB
CANDIDA VAGINITIS: NEGATIVE
Chlamydia: NEGATIVE
Neisseria Gonorrhea: NEGATIVE
Trichomonas: NEGATIVE

## 2016-07-06 LAB — CYTOLOGY - PAP: DIAGNOSIS: NEGATIVE

## 2016-07-07 LAB — OBSTETRIC PANEL, INCLUDING HIV
ANTIBODY SCREEN: NEGATIVE
BASOS: 0 %
Basophils Absolute: 0 10*3/uL (ref 0.0–0.2)
EOS (ABSOLUTE): 0.1 10*3/uL (ref 0.0–0.4)
Eos: 0 %
HEMATOCRIT: 35.9 % (ref 34.0–46.6)
HIV SCREEN 4TH GENERATION: NONREACTIVE
Hemoglobin: 12.1 g/dL (ref 11.1–15.9)
Hepatitis B Surface Ag: NEGATIVE
IMMATURE GRANS (ABS): 0 10*3/uL (ref 0.0–0.1)
Immature Granulocytes: 0 %
Lymphocytes Absolute: 2.2 10*3/uL (ref 0.7–3.1)
Lymphs: 18 %
MCH: 31.3 pg (ref 26.6–33.0)
MCHC: 33.7 g/dL (ref 31.5–35.7)
MCV: 93 fL (ref 79–97)
MONOCYTES: 4 %
Monocytes Absolute: 0.5 10*3/uL (ref 0.1–0.9)
NEUTROS ABS: 9.2 10*3/uL — AB (ref 1.4–7.0)
Neutrophils: 78 %
Platelets: 395 10*3/uL — ABNORMAL HIGH (ref 150–379)
RBC: 3.87 x10E6/uL (ref 3.77–5.28)
RDW: 14.3 % (ref 12.3–15.4)
RPR: NONREACTIVE
RUBELLA: 1.38 {index} (ref >0.99)
Rh Factor: POSITIVE
WBC: 12 10*3/uL — AB (ref 3.4–10.8)

## 2016-07-07 LAB — HEMOGLOBINOPATHY EVALUATION
HGB C: 0 %
HGB S: 0 %
HGB VARIANT: 0 %
Hemoglobin A2 Quantitation: 2.5 % (ref 1.8–3.2)
Hemoglobin F Quantitation: 0 % (ref 0.0–2.0)
Hgb A: 97.5 % (ref 96.4–98.8)

## 2016-07-07 LAB — URINE CULTURE, OB REFLEX: ORGANISM ID, BACTERIA: NO GROWTH

## 2016-07-07 LAB — VITAMIN D 25 HYDROXY (VIT D DEFICIENCY, FRACTURES): VIT D 25 HYDROXY: 6.8 ng/mL — AB (ref 30.0–100.0)

## 2016-07-07 LAB — CULTURE, OB URINE

## 2016-07-11 ENCOUNTER — Other Ambulatory Visit: Payer: Self-pay | Admitting: Obstetrics

## 2016-07-11 DIAGNOSIS — E559 Vitamin D deficiency, unspecified: Secondary | ICD-10-CM

## 2016-07-11 DIAGNOSIS — B9689 Other specified bacterial agents as the cause of diseases classified elsewhere: Secondary | ICD-10-CM

## 2016-07-11 DIAGNOSIS — N760 Acute vaginitis: Principal | ICD-10-CM

## 2016-07-11 MED ORDER — VITAMIN D 50 MCG (2000 UT) PO CAPS: 1.0000 | ORAL_CAPSULE | Freq: Every day | ORAL | 5 refills | Status: DC

## 2016-07-11 MED ORDER — METRONIDAZOLE 500 MG PO TABS: 500.0000 mg | ORAL_TABLET | Freq: Two times a day (BID) | ORAL | 2 refills | Status: DC

## 2016-08-02 ENCOUNTER — Ambulatory Visit (INDEPENDENT_AMBULATORY_CARE_PROVIDER_SITE_OTHER): Payer: Medicaid Other | Admitting: Obstetrics

## 2016-08-02 ENCOUNTER — Encounter: Payer: Self-pay | Admitting: Obstetrics

## 2016-08-02 VITALS — BP 113/74 | HR 79 | Wt 185.0 lb

## 2016-08-02 DIAGNOSIS — O30002 Twin pregnancy, unspecified number of placenta and unspecified number of amniotic sacs, second trimester: Secondary | ICD-10-CM

## 2016-08-02 DIAGNOSIS — Z3480 Encounter for supervision of other normal pregnancy, unspecified trimester: Secondary | ICD-10-CM

## 2016-08-02 NOTE — Progress Notes (Signed)
Subjective:  Kathleen Esparza is a 26 y.o. [phone] at 10499w2d being seen today for ongoing prenatal care.  She is currently monitored for the following issues for this high-risk pregnancy and has Supervision of other normal pregnancy, antepartum on her problem list.  Patient reports no complaints.  Contractions: Not present. Vag. Bleeding: None.   . Denies leaking of fluid.   The following portions of the patient's history were reviewed and updated as appropriate: allergies, current medications, past family history, past medical history, past social history, past surgical history and problem list. Problem list updated.  Objective:   Vitals:   08/02/16 0914  BP: 113/74  Pulse: 79  Weight: 185 lb (83.9 kg)    Fetal Status: Fetal Heart Rate (bpm): 150         General:  Alert, oriented and cooperative. Patient is in no acute distress.  Skin: Skin is warm and dry. No rash noted.   Cardiovascular: Normal heart rate noted  Respiratory: Normal respiratory effort, no problems with respiration noted  Abdomen: Soft, gravid, appropriate for gestational age. Pain/Pressure: Absent     Pelvic:  Cervical exam deferred        Extremities: Normal range of motion.  Edema: None  Mental Status: Normal mood and affect. Normal behavior. Normal judgment and thought content.   Urinalysis:      Assessment and Plan:  Pregnancy: G4P1021 at 3899w2d  1. Supervision of other normal pregnancy, antepartum Rx: - AFP TETRA - US MFM OB COMP + 14 WK; Future  2. Twin gestation in second trimester, unspecified multiple gestation type Rx: - US MFM OB COMP + 14 WK; Future  Preterm labor symptoms and general obstetric precautions including but not limited to vaginal bleeding, contractions, leaking of fluid and fetal movement were reviewed in detail with the patient. Please refer to After Visit Summary for other counseling recommendations.  Return in about 4 weeks (around 08/30/2016) for ROB.   Brock BadHarper, Jahquan Klugh A,  MDPatient ID: Kathleen MawLaquisha S Meinhardt, female   DOB: 08-22-1990, 26 y.o.   MRN: [id number]

## 2016-08-04 ENCOUNTER — Telehealth: Payer: Self-pay

## 2016-08-04 NOTE — Telephone Encounter (Signed)
Attempted to return call, no answer.

## 2016-08-05 ENCOUNTER — Other Ambulatory Visit: Payer: Self-pay | Admitting: Obstetrics

## 2016-08-05 DIAGNOSIS — E559 Vitamin D deficiency, unspecified: Secondary | ICD-10-CM

## 2016-08-05 LAB — AFP TETRA
DIA MOM VALUE: 3.63
DIA Value (EIA): 589.25 pg/mL
DSR (By Age)    1 IN: 966
DSR (Second Trimester) 1 IN: 3779
GESTATIONAL AGE AFP: 15 wk
MSAFP MOM: 1.69
MSAFP: 45.1 ng/mL
MSHCG Mom: 1.16
MSHCG: 53082 m[IU]/mL
Maternal Age At EDD: 26.3 yr
OSB RISK: 6646
T18 (By Age): 1:3762 {titer}
Test Results:: NEGATIVE
UE3 MOM: 1.4
UE3 VALUE: 0.73 ng/mL
Weight: 185 [lb_av]

## 2016-08-05 MED ORDER — VITAMIN D 50 MCG (2000 UT) PO CAPS: 1.0000 | ORAL_CAPSULE | Freq: Every day | ORAL | 5 refills | Status: DC

## 2016-08-10 ENCOUNTER — Other Ambulatory Visit: Payer: Self-pay | Admitting: *Deleted

## 2016-08-10 DIAGNOSIS — B379 Candidiasis, unspecified: Secondary | ICD-10-CM

## 2016-08-10 MED ORDER — TERCONAZOLE 0.8 % VA CREA
1.0000 | TOPICAL_CREAM | Freq: Every day | VAGINAL | 0 refills | Status: DC
Start: 2016-08-10 — End: 2016-10-09

## 2016-08-10 NOTE — Progress Notes (Signed)
Pt called to office with yeast symptoms after completing course of Flagyl. Would like to get sooner appt or be able to treat. Terazol was sent to pharmacy per protocol. Pt advised if symptoms no better after completing tx to contact office for further recommendations.  Pt states understanding.

## 2016-08-29 ENCOUNTER — Inpatient Hospital Stay (HOSPITAL_COMMUNITY)
Admission: EM | Admit: 2016-08-29 | Discharge: 2016-08-29 | Disposition: A | Discharge status: Home / Self Care | Payer: Medicaid Other | Source: Ambulatory Visit | Attending: Family Medicine | Admitting: Family Medicine

## 2016-08-29 ENCOUNTER — Encounter (HOSPITAL_COMMUNITY): Payer: Self-pay | Admitting: *Deleted

## 2016-08-29 DIAGNOSIS — R1030 Lower abdominal pain, unspecified: Secondary | ICD-10-CM | POA: Insufficient documentation

## 2016-08-29 DIAGNOSIS — N859 Noninflammatory disorder of uterus, unspecified: Secondary | ICD-10-CM | POA: Diagnosis not present

## 2016-08-29 DIAGNOSIS — O9989 Other specified diseases and conditions complicating pregnancy, childbirth and the puerperium: Secondary | ICD-10-CM

## 2016-08-29 DIAGNOSIS — O26892 Other specified pregnancy related conditions, second trimester: Secondary | ICD-10-CM | POA: Diagnosis not present

## 2016-08-29 DIAGNOSIS — Z3A19 19 weeks gestation of pregnancy: Secondary | ICD-10-CM | POA: Diagnosis not present

## 2016-08-29 DIAGNOSIS — Z87891 Personal history of nicotine dependence: Secondary | ICD-10-CM | POA: Insufficient documentation

## 2016-08-29 DIAGNOSIS — N858 Other specified noninflammatory disorders of uterus: Secondary | ICD-10-CM

## 2016-08-29 LAB — URINALYSIS, ROUTINE W REFLEX MICROSCOPIC
BILIRUBIN URINE: NEGATIVE
GLUCOSE, UA: NEGATIVE mg/dL
Hgb urine dipstick: NEGATIVE
Ketones, ur: 5 mg/dL — AB
LEUKOCYTES UA: NEGATIVE
NITRITE: NEGATIVE
PH: 6 (ref 5.0–8.0)
Protein, ur: 100 mg/dL — AB
SPECIFIC GRAVITY, URINE: 1.025 (ref 1.005–1.030)

## 2016-08-29 MED ORDER — IBUPROFEN 600 MG PO TABS
600.0000 mg | ORAL_TABLET | Freq: Once | ORAL | Status: AC
  Administered 2016-08-29: 600 mg via ORAL
  Filled 2016-08-29: qty 1

## 2016-08-29 NOTE — MAU Provider Note (Signed)
Chief Complaint:  Abdominal Pain and Pelvic Pain   First Provider Initiated Contact with Patient 08/29/16 1520     HPI: Kathleen Esparza is a 26 y.o. G3P1011 at 5032w1d with twins.who presents to maternity admissions reporting lower abdominal cramping and low back pain since this morning.  Abdominal cramping is accompanied by "balling up" sensations   Has twins.. She reports good fetal movement, denies LOF, vaginal bleeding, vaginal itching/burning, urinary symptoms, h/a, dizziness, n/v, diarrhea, constipation or fever/chills.  She denies headache, visual changes or RUQ abdominal pain.  Abdominal Pain  This is a new problem. The current episode started today. The onset quality is gradual. The problem occurs intermittently. The problem has been waxing and waning. The pain is located in the suprapubic region, RLQ and LLQ. The pain is mild. The quality of the pain is cramping. The abdominal pain does not radiate. Pertinent negatives include no constipation, diarrhea, fever, headaches, myalgias, nausea or vomiting. Nothing aggravates the pain. The pain is relieved by nothing. She has tried nothing for the symptoms.    RN note: [facility] for awhile. Felt balling in lower abd and then started having lower back and lower abd pain. Denies vag bleeding or LOF. Small amt white vag d/c. Just finished treatment for yeast inf. Having pain R lower back that occ goes down R leg. Have felt this pain before but usually goes away   Past Medical History: Past Medical History:  Diagnosis Date  . Medical history non-contributory     Past obstetric history: OB History  Gravida Para Term Preterm AB Living  3 1 1   1 1   SAB TAB Ectopic Multiple Live Births    1     1    # Outcome Date GA Lbr Len/2nd Weight Sex Delivery Anes PTL Lv  3 Current           2 TAB 2015          1 Term 04/20/09 6026w0d   F Vag-Spont EPI  LIV      Past Surgical History: Past Surgical History:  Procedure Laterality  Date  . INDUCED ABORTION    . TONSILLECTOMY  2016    Family History: Family History  Problem Relation Age of Onset  . COPD Maternal Grandmother   . Diabetes Maternal Grandmother   . Other Neg Hx     Social History: Social History  Substance Use Topics  . Smoking status: Former Smoker    Quit date: 2017  . Smokeless tobacco: Never Used  . Alcohol use No    Allergies: No Known Allergies  Meds:  Prescriptions Prior to Admission  Medication Sig Dispense Refill Last Dose  . Cholecalciferol (VITAMIN D) 2000 units CAPS Take 1 capsule (2,000 Units total) by mouth daily. 30 capsule 5 08/29/2016 at Unknown time  . folic acid (FOLVITE) 1 MG tablet Take 1 tablet (1 mg total) by mouth daily. 30 tablet 11 08/29/2016 at Unknown time  . Prenat w/o A-FeCbGl-DSS-FA-DHA (CITRANATAL BLOOM DHA) 90-1 & 300 MG MISC Take 1 tablet by mouth daily before breakfast. 60 each 11 08/29/2016 at Unknown time  . metroNIDAZOLE (FLAGYL) 500 MG tablet Take 1 tablet (500 mg total) by mouth 2 (two) times daily. (Patient not taking: Reported on 08/02/2016) 14 tablet 2 Completed Course at Unknown time  . terconazole (TERAZOL 3) 0.8 % vaginal cream Place 1 applicator vaginally at bedtime. 20 g 0 08/25/2016    I have reviewed patient's Past  Medical Hx, Surgical Hx, Family Hx, Social Hx, medications and allergies.   ROS:  Review of Systems  Constitutional: Negative for fever.  Gastrointestinal: Positive for abdominal pain. Negative for constipation, diarrhea, nausea and vomiting.  Musculoskeletal: Negative for myalgias.  Neurological: Negative for headaches.   Other systems negative  Physical Exam  Patient Vitals for the past 24 hrs:  BP Temp Pulse Resp Height Weight  08/29/16 1453 125/72 98.4 F (36.9 C) 84 18 5\' 4"  (1.626 m) 176 lb (79.8 kg)   Constitutional: Well-developed, well-nourished female in no acute distress.  Cardiovascular: normal rate and rhythm Respiratory: normal effort, clear to auscultation  bilaterally GI: Abd soft, non-tender, gravid appropriate for gestational age.   No rebound or guarding. MS: Extremities nontender, no edema, normal ROM Neurologic: Alert and oriented x 4.  GU: Neg CVAT.  PELVIC EXAM: Cervix long and closed   FHT:  159, 154           Toco:   Uterine irritability   Labs: Results for orders placed or performed during the hospital encounter of 08/29/16 (from the past 24 hour(s))  Urinalysis, Routine w reflex microscopic     Status: Abnormal   Collection Time: 08/29/16  2:30 PM  Result Value Ref Range   Color, Urine YELLOW YELLOW   APPearance HAZY (A) CLEAR   Specific Gravity, Urine 1.025 1.005 - 1.030   pH 6.0 5.0 - 8.0   Glucose, UA NEGATIVE NEGATIVE mg/dL   Hgb urine dipstick NEGATIVE NEGATIVE   Bilirubin Urine NEGATIVE NEGATIVE   Ketones, ur 5 (A) NEGATIVE mg/dL   Protein, ur 161 (A) NEGATIVE mg/dL   Nitrite NEGATIVE NEGATIVE   Leukocytes, UA NEGATIVE NEGATIVE   RBC / HPF 6-30 0 - 5 RBC/hpf   WBC, UA 0-5 0 - 5 WBC/hpf   Bacteria, UA RARE (A) NONE SEEN   Squamous Epithelial / LPF 0-5 (A) NONE SEEN   Mucous PRESENT    Granular Casts, UA PRESENT    O/Positive/-- (05/23 1638)  Imaging:  No results found.  MAU Course/MDM: I have ordered labs and reviewed results.  NST reviewed   Treatments in MAU included PO fluids and one dose of Ibuprofen which did greatly diminish cramping..    Assessment: Twin IUP at [redacted]w[redacted]d Uterine irritability - Plan: Discharge patient   Plan: Discharge home Preterm Labor precautions and fetal kick counts Follow up in Office for prenatal visits and recheck of status  Encouraged to return here or to other Urgent Care/ED if she develops worsening of symptoms, increase in pain, fever, or other concerning symptoms.   Pt stable at time of discharge.  Wynelle Bourgeois CNM, MSN Certified Nurse-Midwife 08/29/2016 3:21 PM

## 2016-08-29 NOTE — Discharge Instructions (Signed)
Preterm Labor and Birth Information The normal length of a pregnancy is 39-41 weeks. Preterm labor is when labor starts before 37 completed weeks of pregnancy. What are the risk factors for preterm labor? Preterm labor is more likely to occur in women who:  Have certain infections during pregnancy such as a bladder infection, sexually transmitted infection, or infection inside the uterus (chorioamnionitis).  Have a shorter-than-normal cervix.  Have gone into preterm labor before.  Have had surgery on their cervix.  Are younger than age 89 or older than age 19.  Are African American.  Are pregnant with twins or multiple babies (multiple gestation).  Take street drugs or smoke while pregnant.  Do not gain enough weight while pregnant.  Became pregnant shortly after having been pregnant.  What are the symptoms of preterm labor? Symptoms of preterm labor include:  Cramps similar to those that can happen during a menstrual period. The cramps may happen with diarrhea.  Pain in the abdomen or lower back.  Regular uterine contractions that may feel like tightening of the abdomen.  A feeling of increased pressure in the pelvis.  Increased watery or bloody mucus discharge from the vagina.  Water breaking (ruptured amniotic sac).  Why is it important to recognize signs of preterm labor? It is important to recognize signs of preterm labor because babies who are born prematurely may not be fully developed. This can put them at an increased risk for:  Long-term (chronic) heart and lung problems.  Difficulty immediately after birth with regulating body systems, including blood sugar, body temperature, heart rate, and breathing rate.  Bleeding in the brain.  Cerebral palsy.  Learning difficulties.  Death.  These risks are highest for babies who are born before 37 weeks of pregnancy. How is preterm labor treated? Treatment depends on the length of your pregnancy, your  condition, and the health of your baby. It may involve:  Having a stitch (suture) placed in your cervix to prevent your cervix from opening too early (cerclage).  Taking or being given medicines, such as: ? Hormone medicines. These may be given early in pregnancy to help support the pregnancy. ? Medicine to stop contractions. ? Medicines to help mature the babys lungs. These may be prescribed if the risk of delivery is high. ? Medicines to prevent your baby from developing cerebral palsy.  If the labor happens before 34 weeks of pregnancy, you may need to stay in the hospital. What should I do if I think I am in preterm labor? If you think that you are going into preterm labor, call your health care provider right away. How can I prevent preterm labor in future pregnancies? To increase your chance of having a full-term pregnancy:  Do not use any tobacco products, such as cigarettes, chewing tobacco, and e-cigarettes. If you need help quitting, ask your health care provider.  Do not use street drugs or medicines that have not been prescribed to you during your pregnancy.  Talk with your health care provider before taking any herbal supplements, even if you have been taking them regularly.  Make sure you gain a healthy amount of weight during your pregnancy.  Watch for infection. If you think that you might have an infection, get it checked right away.  Make sure to tell your health care provider if you have gone into preterm labor before.  This information is not intended to replace advice given to you by your health care provider. Make sure you discuss any questions  you have with your health care provider. Document Released: 04/22/2003 Document Revised: 07/13/2015 Document Reviewed: 06/23/2015 Elsevier Interactive Patient Education  2018 [employer]. Multiple Pregnancy Having a multiple pregnancy means that a woman is carrying more than one baby at a time. She may be pregnant with  twins, triplets, or more. The majority of multiple pregnancies are twins. Naturally conceiving triplets or more (higher-order multiples) is rare. Multiple pregnancies are riskier than single pregnancies. A woman with a multiple pregnancy is more likely to have certain problems during her pregnancy. Therefore, she will need to have more frequent appointments for prenatal care. How does a multiple pregnancy happen? A multiple pregnancy happens when:  The woman's body releases more than one egg at a time, and then each egg gets fertilized by a different sperm. ? This is the most common type of multiple pregnancy. ? Twins or other multiples produced this way are fraternal. They are no more alike than non-multiple siblings are.  One sperm fertilizes one egg, which then divides into more than one embryo. ? Twins or other multiples produced this way are identical. Identical multiples are always the same gender, and they look very much alike.  Who is most likely to have a multiple pregnancy? A multiple pregnancy is more likely to develop in women who:  Have had fertility treatment, especially if the treatment included fertility drugs.  Are older than 26 years of age.  Have already had four or more children.  Have a family history of multiple pregnancy.  How is a multiple pregnancy diagnosed? A multiple pregnancy may be diagnosed based on:  Symptoms such as: ? Rapid weight gain in the first 3 months of pregnancy (first trimester). ? More severe nausea and breast tenderness than what is typical of a single pregnancy. ? The uterus measuring larger than what is normal for the stage of the pregnancy.  Blood tests that detect a higher-than-normal level of human chorionic gonadotropin (hCG). This is a hormone that your body produces in early pregnancy.  Ultrasound exam. This is used to confirm that you are carrying multiples.  What risks are associated with multiple pregnancy? A multiple  pregnancy puts you at a higher risk for certain problems during or after your pregnancy, including:  Having your babies delivered before you have reached a full-term pregnancy (preterm birth). A full-term pregnancy lasts for at least 37 weeks. Babies born before 37 weeks may have a higher risk of a variety of health problems, such as breathing problems, feeding difficulties, cerebral palsy, and learning disabilities.  Diabetes.  Preeclampsia. This is a serious condition that causes high blood pressure along with other symptoms, such as swelling and headaches, during pregnancy.  Excessive blood loss after childbirth (postpartum hemorrhage).  Postpartum depression.  Low birth weight of the babies.  How will having a multiple pregnancy affect my care? Your health care provider will want to monitor you more closely during your pregnancy to make sure that your babies are growing normally and that you are healthy. Follow these instructions at home: Because your pregnancy is considered to be high risk, you will need to work closely with your health care team. You may also need to make some lifestyle changes. These may include the following: Eating and drinking  Increase your nutrition. ? Follow your health care providers recommendations for weight gain. You may need to gain a little extra weight when you are pregnant with multiples. ? Eat healthy snacks often throughout the day. This can add calories  and reduce nausea.  Drink enough fluid to keep your urine clear or pale yellow.  Take prenatal vitamins. Activity By 20-24 weeks, you may need to limit your activities.  Avoid activities and work that take a lot of effort (are strenuous).  Ask your health care provider when you should stop having sexual intercourse.  Rest often.  General instructions  Do not use any products that contain nicotine or tobacco, such as cigarettes and e-cigarettes. If you need help quitting, ask your health  care provider.  Do not drink alcohol or use illegal drugs.  Take over-the-counter and prescription medicines only as told by your health care provider.  Arrange for extra help around the house.  Keep all follow-up visits and all prenatal visits as told by your health care provider. This is important. Contact a health care provider if:  You have dizziness.  You have persistent nausea, vomiting, or diarrhea.  You are having trouble gaining weight.  You have feelings of depression or other emotions that are interfering with your normal activities. Get help right away if:  You have a fever.  You have pain with urination.  You have fluid leaking from your vagina.  You have a bad-smelling vaginal discharge.  You notice increased swelling in your face, hands, legs, or ankles.  You have spotting or bleeding from your vagina.  You have pelvic cramps, pelvic pressure, or nagging pain in your abdomen or lower back.  You are having regular contractions.  You develop a severe headache, with or without visual changes.  You have shortness of breath or chest pain.  You notice less fetal movement, or no fetal movement. This information is not intended to replace advice given to you by your health care provider. Make sure you discuss any questions you have with your health care provider. Document Released: 11/09/2007 Document Revised: 10/01/2015 Document Reviewed: 10/01/2015 Elsevier Interactive Patient Education  [employer]2018 Else[employer] Inc[employer]

## 2016-08-29 NOTE — Progress Notes (Signed)
Kathleen BourgeoisMarie Williams CNM in earlier to discuss d/c plan,. Written and verbal d/c instructions given and understanding voiced.

## 2016-08-29 NOTE — MAU Note (Addendum)
[facility] Felt balling in lower abd and then started having lower back and lower abd pain. Denies vag bleeding or LOF. Small amt white vag d/c. Just finished treatment for yeast inf. Having pain R lower back that occ goes down R leg. Have felt this pain before but usually goes away

## 2016-08-30 ENCOUNTER — Ambulatory Visit (INDEPENDENT_AMBULATORY_CARE_PROVIDER_SITE_OTHER): Payer: Medicaid Other | Admitting: Obstetrics

## 2016-08-30 ENCOUNTER — Encounter: Payer: Self-pay | Admitting: Obstetrics

## 2016-08-30 ENCOUNTER — Ambulatory Visit (HOSPITAL_COMMUNITY)
Admission: RE | Admit: 2016-08-30 | Discharge: 2016-08-30 | Disposition: A | Discharge status: Home / Self Care | Payer: Medicaid Other | Source: Ambulatory Visit | Attending: Obstetrics | Admitting: Obstetrics

## 2016-08-30 ENCOUNTER — Encounter (HOSPITAL_COMMUNITY): Payer: Self-pay

## 2016-08-30 VITALS — BP 129/79 | HR 96 | Wt 192.8 lb

## 2016-08-30 DIAGNOSIS — Z3A19 19 weeks gestation of pregnancy: Secondary | ICD-10-CM | POA: Insufficient documentation

## 2016-08-30 DIAGNOSIS — Z363 Encounter for antenatal screening for malformations: Secondary | ICD-10-CM | POA: Insufficient documentation

## 2016-08-30 DIAGNOSIS — O30042 Twin pregnancy, dichorionic/diamniotic, second trimester: Secondary | ICD-10-CM | POA: Diagnosis present

## 2016-08-30 DIAGNOSIS — Z3480 Encounter for supervision of other normal pregnancy, unspecified trimester: Secondary | ICD-10-CM

## 2016-08-30 DIAGNOSIS — Z3482 Encounter for supervision of other normal pregnancy, second trimester: Secondary | ICD-10-CM

## 2016-08-30 DIAGNOSIS — M5441 Lumbago with sciatica, right side: Secondary | ICD-10-CM

## 2016-08-30 DIAGNOSIS — O30049 Twin pregnancy, dichorionic/diamniotic, unspecified trimester: Secondary | ICD-10-CM

## 2016-08-30 NOTE — Progress Notes (Signed)
Subjective:  Kathleen Esparza is a 26 y.o. [id number] at 53110w2d being seen today for ongoing prenatal care.  She is currently monitored for the following issues for this low-risk pregnancy and has Supervision of other normal pregnancy, antepartum and Acute right-sided low back pain with right-sided sciatica on her problem list.  Patient reports backache.  Contractions: Not present. Vag. Bleeding: None.   . Denies leaking of fluid.   The following portions of the patient's history were reviewed and updated as appropriate: allergies, current medications, past family history, past medical history, past social history, past surgical history and problem list. Problem list updated.  Objective:   Vitals:   08/30/16 0945  BP: 129/79  Pulse: 96  Weight: 192 lb 12.8 oz (87.5 kg)    Fetal Status: Fetal Heart Rate (bpm): 150         General:  Alert, oriented and cooperative. Patient is in no acute distress.  Skin: Skin is warm and dry. No rash noted.   Cardiovascular: Normal heart rate noted  Respiratory: Normal respiratory effort, no problems with respiration noted  Abdomen: Soft, gravid, appropriate for gestational age. Pain/Pressure: Absent     Pelvic:  Cervical exam deferred        Extremities: Normal range of motion.  Edema: None  Mental Status: Normal mood and affect. Normal behavior. Normal judgment and thought content.   Urinalysis:      Assessment and Plan:  Pregnancy: G4P1021 at 43110w2d  1. Supervision of other normal pregnancy, antepartum   2. Acute right-sided low back pain with right-sided sciatica Rx: - Maternity Belt Rx - Tylenol recommended  Preterm labor symptoms and general obstetric precautions including but not limited to vaginal bleeding, contractions, leaking of fluid and fetal movement were reviewed in detail with the patient. Please refer to After Visit Summary for other counseling recommendations.  Return in about 4 weeks (around 09/27/2016) for ROB.   Brock BadHarper,  Dwayna Kentner A, MDPatient ID: Kathleen Esparza, female   DOB: 1991/02/01, 26 y.o.   MRN: [id number]

## 2016-08-30 NOTE — Progress Notes (Signed)
Patient states she feels light fetal flutter movement, denies any pain today.

## 2016-08-31 ENCOUNTER — Other Ambulatory Visit (HOSPITAL_COMMUNITY): Payer: Self-pay | Admitting: *Deleted

## 2016-08-31 DIAGNOSIS — O30049 Twin pregnancy, dichorionic/diamniotic, unspecified trimester: Secondary | ICD-10-CM

## 2016-09-11 ENCOUNTER — Telehealth: Payer: Self-pay

## 2016-09-11 DIAGNOSIS — N898 Other specified noninflammatory disorders of vagina: Secondary | ICD-10-CM

## 2016-09-11 MED ORDER — FLUCONAZOLE 150 MG PO TABS: 150.0000 mg | ORAL_TABLET | Freq: Once | ORAL | 3 refills | Status: AC

## 2016-09-11 NOTE — Telephone Encounter (Signed)
Pt complains of having vaginal itching and irritation after getting that area waxed. States that this is the second time this has happened. Pt would like diflucan sent to the pharmacy. Per Dr. Clearance CootsHarper okay to send in diflucan to the pharmacy.

## 2016-09-27 ENCOUNTER — Ambulatory Visit (INDEPENDENT_AMBULATORY_CARE_PROVIDER_SITE_OTHER): Payer: Medicaid Other | Admitting: Obstetrics

## 2016-09-27 ENCOUNTER — Ambulatory Visit (HOSPITAL_COMMUNITY)
Admission: RE | Admit: 2016-09-27 | Discharge: 2016-09-27 | Disposition: A | Discharge status: Home / Self Care | Payer: Medicaid Other | Source: Ambulatory Visit | Attending: Obstetrics | Admitting: Obstetrics

## 2016-09-27 ENCOUNTER — Encounter (HOSPITAL_COMMUNITY): Payer: Self-pay

## 2016-09-27 DIAGNOSIS — Z3482 Encounter for supervision of other normal pregnancy, second trimester: Secondary | ICD-10-CM

## 2016-09-27 DIAGNOSIS — O30042 Twin pregnancy, dichorionic/diamniotic, second trimester: Secondary | ICD-10-CM | POA: Diagnosis present

## 2016-09-27 DIAGNOSIS — Z6832 Body mass index (BMI) 32.0-32.9, adult: Secondary | ICD-10-CM | POA: Insufficient documentation

## 2016-09-27 DIAGNOSIS — O30049 Twin pregnancy, dichorionic/diamniotic, unspecified trimester: Secondary | ICD-10-CM

## 2016-09-27 DIAGNOSIS — E669 Obesity, unspecified: Secondary | ICD-10-CM | POA: Diagnosis not present

## 2016-09-27 DIAGNOSIS — O99212 Obesity complicating pregnancy, second trimester: Secondary | ICD-10-CM | POA: Insufficient documentation

## 2016-09-27 DIAGNOSIS — Z3A23 23 weeks gestation of pregnancy: Secondary | ICD-10-CM | POA: Diagnosis not present

## 2016-09-27 DIAGNOSIS — Z3480 Encounter for supervision of other normal pregnancy, unspecified trimester: Secondary | ICD-10-CM

## 2016-09-27 NOTE — Progress Notes (Signed)
Patient reports good fetal movement, with occassional irritability and pressure.

## 2016-09-28 NOTE — Progress Notes (Signed)
Subjective:  Kathleen Esparza is a 26 y.o. G4P1021 at 6462w3d being seen today for ongoing prenatal care.  She is currently monitored for the following issues for this low-risk pregnancy and has Supervision of other normal pregnancy, antepartum and Acute right-sided low back pain with right-sided sciatica on her problem list.  Patient reports no complaints.  Contractions: Irritability. Vag. Bleeding: None.  Movement: Present. Denies leaking of fluid.   The following portions of the patient's history were reviewed and updated as appropriate: allergies, current medications, past family history, past medical history, past social history, past surgical history and problem list. Problem list updated.  Objective:   Vitals:   09/27/16 0952  BP: 126/75  Pulse: 92  Weight: 200 lb 9.6 oz (91 kg)    Fetal Status: Fetal Heart Rate (bpm): 150   Movement: Present     General:  Alert, oriented and cooperative. Patient is in no acute distress.  Skin: Skin is warm and dry. No rash noted.   Cardiovascular: Normal heart rate noted  Respiratory: Normal respiratory effort, no problems with respiration noted  Abdomen: Soft, gravid, appropriate for gestational age. Pain/Pressure: Present     Pelvic:  Cervical exam deferred        Extremities: Normal range of motion.  Edema: None  Mental Status: Normal mood and affect. Normal behavior. Normal judgment and thought content.   Urinalysis:      Assessment and Plan:  Pregnancy: G4P1021 at 662w3d  1. Supervision of other normal pregnancy, antepartum   Preterm labor symptoms and general obstetric precautions including but not limited to vaginal bleeding, contractions, leaking of fluid and fetal movement were reviewed in detail with the patient. Please refer to After Visit Summary for other counseling recommendations.  Return in about 4 weeks (around 10/25/2016) for ROB.   Brock BadHarper, Sukhraj Esquivias A, MD

## 2016-10-09 ENCOUNTER — Inpatient Hospital Stay (HOSPITAL_COMMUNITY)
Admission: AD | Admit: 2016-10-09 | Discharge: 2016-10-09 | Disposition: A | Discharge status: Home / Self Care | Payer: Medicaid Other | Source: Ambulatory Visit | Attending: Obstetrics & Gynecology | Admitting: Obstetrics & Gynecology

## 2016-10-09 ENCOUNTER — Inpatient Hospital Stay (HOSPITAL_COMMUNITY): Payer: Medicaid Other

## 2016-10-09 DIAGNOSIS — O9989 Other specified diseases and conditions complicating pregnancy, childbirth and the puerperium: Secondary | ICD-10-CM | POA: Insufficient documentation

## 2016-10-09 DIAGNOSIS — O26899 Other specified pregnancy related conditions, unspecified trimester: Secondary | ICD-10-CM

## 2016-10-09 DIAGNOSIS — Z3A25 25 weeks gestation of pregnancy: Secondary | ICD-10-CM | POA: Diagnosis not present

## 2016-10-09 DIAGNOSIS — O30042 Twin pregnancy, dichorionic/diamniotic, second trimester: Secondary | ICD-10-CM

## 2016-10-09 DIAGNOSIS — R109 Unspecified abdominal pain: Secondary | ICD-10-CM | POA: Insufficient documentation

## 2016-10-09 DIAGNOSIS — O30002 Twin pregnancy, unspecified number of placenta and unspecified number of amniotic sacs, second trimester: Secondary | ICD-10-CM

## 2016-10-09 LAB — URINALYSIS, ROUTINE W REFLEX MICROSCOPIC
Bilirubin Urine: NEGATIVE
GLUCOSE, UA: NEGATIVE mg/dL
Hgb urine dipstick: NEGATIVE
Ketones, ur: NEGATIVE mg/dL
Leukocytes, UA: NEGATIVE
Nitrite: NEGATIVE
PH: 6 (ref 5.0–8.0)
Protein, ur: 30 mg/dL — AB
SPECIFIC GRAVITY, URINE: 1.021 (ref 1.005–1.030)

## 2016-10-09 LAB — WET PREP, GENITAL
CLUE CELLS WET PREP: NONE SEEN
Sperm: NONE SEEN
TRICH WET PREP: NONE SEEN
WBC, Wet Prep HPF POC: NONE SEEN
Yeast Wet Prep HPF POC: NONE SEEN

## 2016-10-09 LAB — FETAL FIBRONECTIN: FETAL FIBRONECTIN: POSITIVE — AB

## 2016-10-09 MED ORDER — NIFEDIPINE 10 MG PO CAPS
10.0000 mg | ORAL_CAPSULE | ORAL | Status: DC | PRN
  Administered 2016-10-09: 10 mg via ORAL
  Filled 2016-10-09: qty 1

## 2016-10-09 MED ORDER — NIFEDIPINE 10 MG PO CAPS: 10.0000 mg | ORAL_CAPSULE | ORAL | Status: DC | PRN

## 2016-10-09 MED ORDER — SODIUM CHLORIDE 0.9 % IV SOLN
INTRAVENOUS | Status: DC
  Administered 2016-10-09: 09:00:00 via INTRAVENOUS

## 2016-10-09 NOTE — Discharge Instructions (Signed)

## 2016-10-09 NOTE — MAU Note (Signed)
Urine in lab 

## 2016-10-09 NOTE — MAU Provider Note (Signed)
Patient Kathleen Esparza is a 26 y.o. [phone] At [redacted]w[redacted]d here with complaints of balling up and pressure that started last night at 10 pm. [facility] denies bleeding, leaking of fluid or decreased fetal movements.  History     CSN: [id number]  Arrival date and time: 10/09/16 0749   None     Chief Complaint  Patient presents with  . Abdominal Pain   Abdominal Pain  This is a new problem. The current episode started yesterday. The onset quality is sudden. The pain is located in the suprapubic region and rectum. The pain is at a severity of 6/10. The quality of the pain is cramping. Associated symptoms include vomiting. Pertinent negatives include no diarrhea. Associated symptoms comments: One episode of vomithing this morning.  Patient has had this pain before; was treated for similar complaint in July. Received IV fluids and procardia at that visit as well.   OB History    Gravida Para Term Preterm AB Living   4 1 1   2 1    SAB TAB Ectopic Multiple Live Births     2     1      Past Medical History:  Diagnosis Date  . Medical history non-contributory     Past Surgical History:  Procedure Laterality Date  . INDUCED ABORTION    . TONSILLECTOMY  2016    Family History  Problem Relation Age of Onset  . COPD Maternal Grandmother   . Diabetes Maternal Grandmother   . Other Neg Hx     Social History  Substance Use Topics  . Smoking status: Former Smoker    Quit date: 2017  . Smokeless tobacco: Never Used  . Alcohol use No    Allergies: No Known Allergies  Prescriptions Prior to Admission  Medication Sig Dispense Refill Last Dose  . Cholecalciferol (VITAMIN D) 2000 units CAPS Take 1 capsule (2,000 Units total) by mouth daily. 30 capsule 5 Taking  . folic acid (FOLVITE) 1 MG tablet Take 1 tablet (1 mg total) by mouth daily. 30 tablet 11 Taking  . metroNIDAZOLE (FLAGYL) 500 MG tablet Take 1 tablet (500 mg total) by mouth 2 (two) times daily. (Patient not taking: Reported on  08/02/2016) 14 tablet 2 Not Taking  . Prenat w/o A-FeCbGl-DSS-FA-DHA (CITRANATAL BLOOM DHA) 90-1 & 300 MG MISC Take 1 tablet by mouth daily before breakfast. 60 each 11 Taking  . terconazole (TERAZOL 3) 0.8 % vaginal cream Place 1 applicator vaginally at bedtime. (Patient not taking: Reported on 08/30/2016) 20 g 0 Not Taking    Review of Systems  Respiratory: Negative.   Cardiovascular: Negative.   Gastrointestinal: Positive for abdominal pain and vomiting. Negative for diarrhea.  Genitourinary: Negative.   Musculoskeletal: Negative.   Neurological: Negative.   Psychiatric/Behavioral: Negative.    Physical Exam   Blood pressure (!) 116/51, pulse 90, temperature 98.1 F (36.7 C), temperature source Oral, resp. rate 18, last menstrual period 03/24/2016.  Physical Exam  Constitutional: She is oriented to person, place, and time. She appears well-developed and well-nourished.  HENT:  Head: Normocephalic.  Eyes: Pupils are equal, round, and reactive to light.  Respiratory: Effort normal.  GI: Soft.  Genitourinary:  Genitourinary Comments: NEFG; no lesions on external vagina. Cervix is closed, long and thick.   Musculoskeletal: Normal range of motion.  Neurological: She is alert and oriented to person, place, and time.  Skin: Skin is warm and dry.  Psychiatric: She has a normal mood and affect.  MAU Course  Procedures  MDM -US shows active fetus x 2 with normal AFI and heart beat.  -ffn is positive, although cervix is closed.  -wet prep negative -UA-negative for signs of infection Patient feels better after IV fluid and one dose of procardia.   Assessment and Plan   1. Preterm labor in second trimester without delivery   2. Twin gestation in second trimester   3. [redacted] weeks gestation of pregnancy   4. Dichorionic diamniotic twin pregnancy in second trimester   5. Abdominal pain complicating pregnancy    2. Patient stable for discharge with strict return precautions.  3.  Patient verbalized understanding; will keep her appointment on 10-26-2016.   Charlesetta Garibaldi Kooistra CNM 10/09/2016, 8:27 AM

## 2016-10-09 NOTE — MAU Note (Signed)
Pt C/O "balling up" pain & pressure in her abdomen since last night.  Denies bleeding or LOF.  Reports good fetal movement.  Vomited once this morning, denies diarrhea.

## 2016-10-10 LAB — GC/CHLAMYDIA PROBE AMP (~~LOC~~) NOT AT ARMC
Chlamydia: NEGATIVE
NEISSERIA GONORRHEA: NEGATIVE

## 2016-10-24 ENCOUNTER — Encounter: Payer: Self-pay | Admitting: Obstetrics

## 2016-10-24 ENCOUNTER — Ambulatory Visit (INDEPENDENT_AMBULATORY_CARE_PROVIDER_SITE_OTHER): Payer: Medicaid Other | Admitting: Obstetrics

## 2016-10-24 ENCOUNTER — Other Ambulatory Visit: Payer: Medicaid Other

## 2016-10-24 VITALS — BP 119/74 | HR 92 | Wt 203.6 lb

## 2016-10-24 DIAGNOSIS — Z3480 Encounter for supervision of other normal pregnancy, unspecified trimester: Secondary | ICD-10-CM

## 2016-10-24 DIAGNOSIS — N63 Unspecified lump in unspecified breast: Secondary | ICD-10-CM

## 2016-10-24 DIAGNOSIS — Z3482 Encounter for supervision of other normal pregnancy, second trimester: Secondary | ICD-10-CM

## 2016-10-24 NOTE — Progress Notes (Addendum)
Subjective:  Kathleen Esparza is a 26 y.o. G4P1021 at 998w1d being seen today for ongoing prenatal care.  She is currently monitored for the following issues for this high-risk pregnancy and has Supervision of other normal pregnancy, antepartum and Acute right-sided low back pain with right-sided sciatica on her problem list.  Patient reports backache.  Contractions: Not present. Vag. Bleeding: None.  Movement: Present. Denies leaking of fluid.   The following portions of the patient's history were reviewed and updated as appropriate: allergies, current medications, past family history, past medical history, past social history, past surgical history and problem list. Problem list updated.  Objective:   Vitals:   10/24/16 0927  BP: 119/74  Pulse: 92  Weight: 203 lb 9.6 oz (92.4 kg)    Fetal Status: Fetal Heart Rate (bpm): 150   Movement: Present     General:  Alert, oriented and cooperative. Patient is in no acute distress.  Skin: Skin is warm and dry. No rash noted.   Cardiovascular: Normal heart rate noted  Respiratory: Normal respiratory effort, no problems with respiration noted  Abdomen: Soft, gravid, appropriate for gestational age. Pain/Pressure: Absent     Pelvic:  Cervical exam deferred        Extremities: Normal range of motion.  Edema: None  Mental Status: Normal mood and affect. Normal behavior. Normal judgment and thought content.   Urinalysis:      Assessment and Plan:  Pregnancy: G4P1021 at 368w1d  1. Supervision of other normal pregnancy, antepartum Rx: - Glucose Tolerance, 2 Hours w/1 Hour - HIV antibody - CBC - RPR  2. Breast lump in female - resolved spontaneously, probable glandular change with pregnancy.  Will follow.   Preterm labor symptoms and general obstetric precautions including but not limited to vaginal bleeding, contractions, leaking of fluid and fetal movement were reviewed in detail with the patient. Please refer to After Visit Summary for  other counseling recommendations.  Return in about 2 weeks (around 11/07/2016) for ROB.   Brock BadHarper, Charles A, MD

## 2016-10-24 NOTE — Progress Notes (Signed)
Patient is doing well-she does have questions about breast lump that comes and goes.

## 2016-10-25 ENCOUNTER — Encounter (HOSPITAL_COMMUNITY): Payer: Self-pay

## 2016-10-25 ENCOUNTER — Other Ambulatory Visit: Payer: Self-pay | Admitting: Obstetrics

## 2016-10-25 ENCOUNTER — Other Ambulatory Visit (HOSPITAL_COMMUNITY): Payer: Self-pay | Admitting: *Deleted

## 2016-10-25 ENCOUNTER — Ambulatory Visit (HOSPITAL_COMMUNITY)
Admission: RE | Admit: 2016-10-25 | Discharge: 2016-10-25 | Disposition: A | Discharge status: Home / Self Care | Payer: Medicaid Other | Source: Ambulatory Visit | Attending: Obstetrics | Admitting: Obstetrics

## 2016-10-25 DIAGNOSIS — O30049 Twin pregnancy, dichorionic/diamniotic, unspecified trimester: Secondary | ICD-10-CM | POA: Diagnosis present

## 2016-10-25 DIAGNOSIS — O30043 Twin pregnancy, dichorionic/diamniotic, third trimester: Secondary | ICD-10-CM

## 2016-10-25 DIAGNOSIS — Z3A27 27 weeks gestation of pregnancy: Secondary | ICD-10-CM | POA: Diagnosis not present

## 2016-10-25 DIAGNOSIS — D508 Other iron deficiency anemias: Secondary | ICD-10-CM

## 2016-10-25 LAB — GLUCOSE TOLERANCE, 2 HOURS W/ 1HR
GLUCOSE, 1 HOUR: 141 mg/dL (ref 65–179)
Glucose, 2 hour: 101 mg/dL (ref 65–152)
Glucose, Fasting: 82 mg/dL (ref 65–91)

## 2016-10-25 LAB — CBC
Hematocrit: 31.4 % — ABNORMAL LOW (ref 34.0–46.6)
Hemoglobin: 10.7 g/dL — ABNORMAL LOW (ref 11.1–15.9)
MCH: 32.5 pg (ref 26.6–33.0)
MCHC: 34.1 g/dL (ref 31.5–35.7)
MCV: 95 fL (ref 79–97)
Platelets: 258 10*3/uL (ref 150–379)
RBC: 3.29 x10E6/uL — ABNORMAL LOW (ref 3.77–5.28)
RDW: 14.3 % (ref 12.3–15.4)
WBC: 11.5 10*3/uL — ABNORMAL HIGH (ref 3.4–10.8)

## 2016-10-25 LAB — RPR: RPR: NONREACTIVE

## 2016-10-25 LAB — HIV ANTIBODY (ROUTINE TESTING W REFLEX): HIV SCREEN 4TH GENERATION: NONREACTIVE

## 2016-10-25 MED ORDER — FERROUS SULFATE 325 (65 FE) MG PO TABS: 325.0000 mg | ORAL_TABLET | Freq: Two times a day (BID) | ORAL | 5 refills | Status: DC

## 2016-11-07 ENCOUNTER — Encounter: Payer: Self-pay | Admitting: Obstetrics

## 2016-11-07 ENCOUNTER — Inpatient Hospital Stay (HOSPITAL_COMMUNITY)
Admission: AD | Admit: 2016-11-07 | Discharge: 2016-11-07 | Disposition: A | Discharge status: Home / Self Care | Payer: Medicaid Other | Source: Ambulatory Visit | Attending: Family Medicine | Admitting: Family Medicine

## 2016-11-07 ENCOUNTER — Ambulatory Visit (INDEPENDENT_AMBULATORY_CARE_PROVIDER_SITE_OTHER): Payer: Medicaid Other | Admitting: Obstetrics

## 2016-11-07 ENCOUNTER — Encounter (HOSPITAL_COMMUNITY): Payer: Self-pay | Admitting: *Deleted

## 2016-11-07 VITALS — BP 117/72 | HR 88 | Wt 202.2 lb

## 2016-11-07 DIAGNOSIS — O30043 Twin pregnancy, dichorionic/diamniotic, third trimester: Secondary | ICD-10-CM

## 2016-11-07 DIAGNOSIS — O30049 Twin pregnancy, dichorionic/diamniotic, unspecified trimester: Secondary | ICD-10-CM

## 2016-11-07 DIAGNOSIS — M549 Dorsalgia, unspecified: Secondary | ICD-10-CM

## 2016-11-07 DIAGNOSIS — O9989 Other specified diseases and conditions complicating pregnancy, childbirth and the puerperium: Secondary | ICD-10-CM | POA: Diagnosis not present

## 2016-11-07 DIAGNOSIS — R109 Unspecified abdominal pain: Secondary | ICD-10-CM | POA: Insufficient documentation

## 2016-11-07 DIAGNOSIS — R1084 Generalized abdominal pain: Secondary | ICD-10-CM

## 2016-11-07 DIAGNOSIS — Z79899 Other long term (current) drug therapy: Secondary | ICD-10-CM | POA: Insufficient documentation

## 2016-11-07 DIAGNOSIS — Z833 Family history of diabetes mellitus: Secondary | ICD-10-CM | POA: Diagnosis not present

## 2016-11-07 DIAGNOSIS — O26893 Other specified pregnancy related conditions, third trimester: Secondary | ICD-10-CM | POA: Diagnosis present

## 2016-11-07 DIAGNOSIS — Z9889 Other specified postprocedural states: Secondary | ICD-10-CM | POA: Diagnosis not present

## 2016-11-07 DIAGNOSIS — Z23 Encounter for immunization: Secondary | ICD-10-CM | POA: Diagnosis not present

## 2016-11-07 DIAGNOSIS — Z87891 Personal history of nicotine dependence: Secondary | ICD-10-CM | POA: Diagnosis not present

## 2016-11-07 DIAGNOSIS — Z3A29 29 weeks gestation of pregnancy: Secondary | ICD-10-CM | POA: Insufficient documentation

## 2016-11-07 DIAGNOSIS — O0990 Supervision of high risk pregnancy, unspecified, unspecified trimester: Secondary | ICD-10-CM

## 2016-11-07 DIAGNOSIS — O0993 Supervision of high risk pregnancy, unspecified, third trimester: Secondary | ICD-10-CM

## 2016-11-07 DIAGNOSIS — Z825 Family history of asthma and other chronic lower respiratory diseases: Secondary | ICD-10-CM | POA: Diagnosis not present

## 2016-11-07 HISTORY — DX: Twin pregnancy, dichorionic/diamniotic, unspecified trimester: O30.049

## 2016-11-07 LAB — URINALYSIS, ROUTINE W REFLEX MICROSCOPIC
Bilirubin Urine: NEGATIVE
Glucose, UA: NEGATIVE mg/dL
HGB URINE DIPSTICK: NEGATIVE
Ketones, ur: NEGATIVE mg/dL
Leukocytes, UA: NEGATIVE
NITRITE: NEGATIVE
Protein, ur: NEGATIVE mg/dL
SPECIFIC GRAVITY, URINE: 1.02 (ref 1.005–1.030)
pH: 7 (ref 5.0–8.0)

## 2016-11-07 MED ORDER — NIFEDIPINE 10 MG PO CAPS
10.0000 mg | ORAL_CAPSULE | ORAL | Status: AC
  Administered 2016-11-07 (×2): 10 mg via ORAL
  Filled 2016-11-07 (×2): qty 1

## 2016-11-07 MED ORDER — ACETAMINOPHEN 500 MG PO TABS
1000.0000 mg | ORAL_TABLET | Freq: Four times a day (QID) | ORAL | Status: DC | PRN
  Administered 2016-11-07: 1000 mg via ORAL
  Filled 2016-11-07: qty 2

## 2016-11-07 MED ORDER — NIFEDIPINE 10 MG PO CAPS: 10.0000 mg | ORAL_CAPSULE | ORAL | 1 refills | Status: DC | PRN

## 2016-11-07 NOTE — MAU Note (Signed)
+  contractions Started 30 minutes ago 5-6 apart Rating pain 5/10  Denies LOF or VB.  +FM

## 2016-11-07 NOTE — Discharge Instructions (Signed)
Multiple Pregnancy Having a multiple pregnancy means that a woman is carrying more than one baby at a time. She may be pregnant with twins, triplets, or more. The majority of multiple pregnancies are twins. Naturally conceiving triplets or more (higher-order multiples) is rare. Multiple pregnancies are riskier than single pregnancies. A woman with a multiple pregnancy is more likely to have certain problems during her pregnancy. Therefore, she will need to have more frequent appointments for prenatal care. How does a multiple pregnancy happen? A multiple pregnancy happens when:  The woman's body releases more than one egg at a time, and then each egg gets fertilized by a different sperm. ? This is the most common type of multiple pregnancy. ? Twins or other multiples produced this way are fraternal. They are no more alike than non-multiple siblings are.  One sperm fertilizes one egg, which then divides into more than one embryo. ? Twins or other multiples produced this way are identical. Identical multiples are always the same gender, and they look very much alike.  Who is most likely to have a multiple pregnancy? A multiple pregnancy is more likely to develop in women who:  Have had fertility treatment, especially if the treatment included fertility drugs.  Are older than 26 years of age.  Have already had four or more children.  Have a family history of multiple pregnancy.  How is a multiple pregnancy diagnosed? A multiple pregnancy may be diagnosed based on:  Symptoms such as: ? Rapid weight gain in the first 3 months of pregnancy (first trimester). ? More severe nausea and breast tenderness than what is typical of a single pregnancy. ? The uterus measuring larger than what is normal for the stage of the pregnancy.  Blood tests that detect a higher-than-normal level of human chorionic gonadotropin (hCG). This is a hormone that your body produces in early pregnancy.  Ultrasound  exam. This is used to confirm that you are carrying multiples.  What risks are associated with multiple pregnancy? A multiple pregnancy puts you at a higher risk for certain problems during or after your pregnancy, including:  Having your babies delivered before you have reached a full-term pregnancy (preterm birth). A full-term pregnancy lasts for at least 37 weeks. Babies born before 37 weeks may have a higher risk of a variety of health problems, such as breathing problems, feeding difficulties, cerebral palsy, and learning disabilities.  Diabetes.  Preeclampsia. This is a serious condition that causes high blood pressure along with other symptoms, such as swelling and headaches, during pregnancy.  Excessive blood loss after childbirth (postpartum hemorrhage).  Postpartum depression.  Low birth weight of the babies.  How will having a multiple pregnancy affect my care? Your health care provider will want to monitor you more closely during your pregnancy to make sure that your babies are growing normally and that you are healthy. Follow these instructions at home: Because your pregnancy is considered to be high risk, you will need to work closely with your health care team. You may also need to make some lifestyle changes. These may include the following: Eating and drinking  Increase your nutrition. ? Follow your health care provider's recommendations for weight gain. You may need to gain a little extra weight when you are pregnant with multiples. ? Eat healthy snacks often throughout the day. This can add calories and reduce nausea.  Drink enough fluid to keep your urine clear or pale yellow.  Take prenatal vitamins. Activity By 20-24 weeks, you may   need to limit your activities.  Avoid activities and work that take a lot of effort (are strenuous).  Ask your health care provider when you should stop having sexual intercourse.  Rest often.  General instructions  Do not use  any products that contain nicotine or tobacco, such as cigarettes and e-cigarettes. If you need help quitting, ask your health care provider.  Do not drink alcohol or use illegal drugs.  Take over-the-counter and prescription medicines only as told by your health care provider.  Arrange for extra help around the house.  Keep all follow-up visits and all prenatal visits as told by your health care provider. This is important. Contact a health care provider if:  You have dizziness.  You have persistent nausea, vomiting, or diarrhea.  You are having trouble gaining weight.  You have feelings of depression or other emotions that are interfering with your normal activities. Get help right away if:  You have a fever.  You have pain with urination.  You have fluid leaking from your vagina.  You have a bad-smelling vaginal discharge.  You notice increased swelling in your face, hands, legs, or ankles.  You have spotting or bleeding from your vagina.  You have pelvic cramps, pelvic pressure, or nagging pain in your abdomen or lower back.  You are having regular contractions.  You develop a severe headache, with or without visual changes.  You have shortness of breath or chest pain.  You notice less fetal movement, or no fetal movement. This information is not intended to replace advice given to you by your health care provider. Make sure you discuss any questions you have with your health care provider. Document Released: 11/09/2007 Document Revised: 10/01/2015 Document Reviewed: 10/01/2015 Elsevier Interactive Patient Education  2018 [employer]  

## 2016-11-07 NOTE — Progress Notes (Signed)
Subjective:  Kathleen Esparza is a 26 y.o. G4P1021 at [redacted]w[redacted]d being seen today for ongoing prenatal care.  She is currently monitored for the following issues for this high-risk pregnancy and has Supervision of other normal pregnancy, antepartum and Acute right-sided low back pain with right-sided sciatica on her problem list.  Patient reports backache.  Contractions: Irregular. Vag. Bleeding: None.  Movement: Present. Denies leaking of fluid.   The following portions of the patient's history were reviewed and updated as appropriate: allergies, current medications, past family history, past medical history, past social history, past surgical history and problem list. Problem list updated.  Objective:   Vitals:   11/07/16 0931  BP: 117/72  Pulse: 88  Weight: 202 lb 3.2 oz (91.7 kg)    Fetal Status: Fetal Heart Rate (bpm): 150/140   Movement: Present     General:  Alert, oriented and cooperative. Patient is in no acute distress.  Skin: Skin is warm and dry. No rash noted.   Cardiovascular: Normal heart rate noted  Respiratory: Normal respiratory effort, no problems with respiration noted  Abdomen: Soft, gravid, appropriate for gestational age. Pain/Pressure: Present     Pelvic:  Cervical exam deferred        Extremities: Normal range of motion.  Edema: None  Mental Status: Normal mood and affect. Normal behavior. Normal judgment and thought content.   Urinalysis:      Assessment and Plan:  Pregnancy: G4P1021 at [redacted]w[redacted]d  1. Supervision of high risk pregnancy, antepartum Rx: - Tdap vaccine greater than or equal to 7yo IM  2. Dichorionic diamniotic twin pregnancy, antepartum  3. Backache symptom Rx: - Maternity Belt  Preterm labor symptoms and general obstetric precautions including but not limited to vaginal bleeding, contractions, leaking of fluid and fetal movement were reviewed in detail with the patient. Please refer to After Visit Summary for other counseling recommendations.   Return in about 2 weeks (around 11/21/2016) for ROB.   Brock Bad, MD

## 2016-11-07 NOTE — MAU Provider Note (Signed)
History     CSN: [id number]  Arrival date and time: 11/07/16 7829   First Provider Initiated Contact with Patient 11/07/16 1909      Chief Complaint  Patient presents with  . Contractions   [id number] .1 wks with didi twins here with ctx. Reports feeling a sharp pain in her vagina around 1800 then started feeling ctx q5 min after that. Denies VB, LOF, and vaginal discharge. Reports good FM x2.   OB History    Gravida Para Term Preterm AB Living   SAB TAB Ectopic Multiple Live Births     2     1      Past Medical History:  Diagnosis Date  . Medical history non-contributory     Past Surgical History:  Procedure Laterality Date  . INDUCED ABORTION    . TONSILLECTOMY  2016    Family History  Problem Relation Age of Onset  . COPD Maternal Grandmother   . Diabetes Maternal Grandmother   . Other Neg Hx     Social History  Substance Use Topics  . Smoking status: Former Smoker    Quit date: 2017  . Smokeless tobacco: Never Used  . Alcohol use No    Allergies: No Known Allergies  Prescriptions Prior to Admission  Medication Sig Dispense Refill Last Dose  . cholecalciferol (VITAMIN D) 1000 units tablet Take 2,000 Units by mouth daily.   Taking  . ferrous sulfate 325 (65 FE) MG tablet Take 1 tablet (325 mg total) by mouth 2 (two) times daily with a meal. 60 tablet 5 Taking  . folic acid (FOLVITE) 1 MG tablet Take 1 mg by mouth daily.   Taking  . Prenatal Vit-Fe Fumarate-FA (MULTIVITAMIN-PRENATAL) 27-0.8 MG TABS tablet Take 1 tablet by mouth daily at 12 noon.   Taking    Review of Systems  Gastrointestinal: Positive for abdominal pain.  Genitourinary: Negative for vaginal bleeding and vaginal discharge.   Physical Exam   Blood pressure 112/68, pulse 98, temperature 97.9 F (36.6 C), temperature source Oral, resp. rate 17, weight 201 lb 1.3 oz (91.2 kg), last menstrual period 03/24/2016, SpO2 100 %.  Physical Exam  Constitutional: She is oriented  to person, place, and time. She appears well-developed and well-nourished. No distress.  HENT:  Head: Normocephalic and atraumatic.  Neck: Normal range of motion.  Respiratory: Effort normal. No respiratory distress.  GI: Soft. She exhibits no distension. There is no tenderness.  gravid  Genitourinary:  Genitourinary Comments: Cervix closed/thick  Musculoskeletal: Normal range of motion.  Neurological: She is alert and oriented to person, place, and time.  Skin: Skin is warm and dry.  Psychiatric: She has a normal mood and affect.  EFM-A: 145 bpm, mod variability, + accels, no decels EFM-B: 140 bpm, mod variability, + accels, no decels Toco: irregular  Results for orders placed or performed during the hospital encounter of 11/07/16 (from the past 24 hour(s))  Urinalysis, Routine w reflex microscopic     Status: Abnormal   Collection Time: 11/07/16  6:37 PM  Result Value Ref Range   Color, Urine YELLOW YELLOW   APPearance HAZY (A) CLEAR   Specific Gravity, Urine 1.020 1.005 - 1.030   pH 7.0 5.0 - 8.0   Glucose, UA NEGATIVE NEGATIVE mg/dL   Hgb urine dipstick NEGATIVE NEGATIVE   Bilirubin Urine NEGATIVE NEGATIVE   Ketones, ur NEGATIVE NEGATIVE mg/dL   Protein, ur NEGATIVE NEGATIVE mg/dL   Nitrite  NEGATIVE NEGATIVE   Leukocytes, UA NEGATIVE NEGATIVE   MAU Course  Procedures Po hydration Procardia x 2; patient no longer feels her contractions.    MDM Labs ordered and reviewed. No evidence of UTI or PTL.  2005: Transfer of care given to Watson, CNM.  NST: Baby A: 150 bpm, mod var, present acel, occasional var and loss of contact, uterine irritability Baby B: 150 bpm, mod var, present acel, occasioanal variables and loss of contaction, uterine irritability.  Assessment and Plan   1. Generalized abdominal pain    2. Patient stable for discharge with recommendations to keep her next ob visit and RX for procardia PRN.  3. Return precautions given.  4. Patient and partner  verbalized understanding; all questions answered.   Luna Kitchens CNM

## 2016-11-21 ENCOUNTER — Encounter: Payer: Medicaid Other | Admitting: Obstetrics

## 2016-11-21 ENCOUNTER — Ambulatory Visit (INDEPENDENT_AMBULATORY_CARE_PROVIDER_SITE_OTHER): Payer: Medicaid Other | Admitting: Obstetrics

## 2016-11-21 VITALS — BP 121/80 | HR 103 | Wt 210.0 lb

## 2016-11-21 DIAGNOSIS — O0990 Supervision of high risk pregnancy, unspecified, unspecified trimester: Secondary | ICD-10-CM

## 2016-11-21 DIAGNOSIS — O30043 Twin pregnancy, dichorionic/diamniotic, third trimester: Secondary | ICD-10-CM

## 2016-11-21 DIAGNOSIS — O0993 Supervision of high risk pregnancy, unspecified, third trimester: Secondary | ICD-10-CM

## 2016-11-21 DIAGNOSIS — O30049 Twin pregnancy, dichorionic/diamniotic, unspecified trimester: Secondary | ICD-10-CM

## 2016-11-22 ENCOUNTER — Other Ambulatory Visit (HOSPITAL_COMMUNITY): Payer: Self-pay | Admitting: Maternal and Fetal Medicine

## 2016-11-22 ENCOUNTER — Encounter (HOSPITAL_COMMUNITY): Payer: Self-pay

## 2016-11-22 ENCOUNTER — Ambulatory Visit (HOSPITAL_COMMUNITY)
Admission: RE | Admit: 2016-11-22 | Discharge: 2016-11-22 | Disposition: A | Discharge status: Home / Self Care | Payer: Medicaid Other | Source: Ambulatory Visit | Attending: Obstetrics | Admitting: Obstetrics

## 2016-11-22 ENCOUNTER — Encounter: Payer: Self-pay | Admitting: Obstetrics

## 2016-11-22 ENCOUNTER — Other Ambulatory Visit (HOSPITAL_COMMUNITY): Payer: Self-pay | Admitting: *Deleted

## 2016-11-22 DIAGNOSIS — Z3A31 31 weeks gestation of pregnancy: Secondary | ICD-10-CM | POA: Diagnosis not present

## 2016-11-22 DIAGNOSIS — O99213 Obesity complicating pregnancy, third trimester: Secondary | ICD-10-CM

## 2016-11-22 DIAGNOSIS — O30043 Twin pregnancy, dichorionic/diamniotic, third trimester: Secondary | ICD-10-CM

## 2016-11-22 DIAGNOSIS — O30049 Twin pregnancy, dichorionic/diamniotic, unspecified trimester: Secondary | ICD-10-CM

## 2016-11-22 NOTE — Progress Notes (Signed)
Subjective:  Kathleen Esparza is a 26 y.o. G4P1021 at [redacted]w[redacted]d being seen today for ongoing prenatal care.  She is currently monitored for the following issues for this high-risk pregnancy and has Supervision of other normal pregnancy, antepartum; Acute right-sided low back pain with right-sided sciatica; and Dichorionic diamniotic twin gestation on her problem list.  Patient reports no complaints.  Contractions: Not present. Vag. Bleeding: None.  Movement: Present. Denies leaking of fluid.   The following portions of the patient's history were reviewed and updated as appropriate: allergies, current medications, past family history, past medical history, past social history, past surgical history and problem list. Problem list updated.  Objective:   Vitals:   11/21/16 1314  BP: 121/80  Pulse: (!) 103  Weight: 210 lb (95.3 kg)    Fetal Status: Fetal Heart Rate (bpm): 150/140   Movement: Present     General:  Alert, oriented and cooperative. Patient is in no acute distress.  Skin: Skin is warm and dry. No rash noted.   Cardiovascular: Normal heart rate noted  Respiratory: Normal respiratory effort, no problems with respiration noted  Abdomen: Soft, gravid, appropriate for gestational age. Pain/Pressure: Absent     Pelvic:  Cervical exam deferred        Extremities: Normal range of motion.     Mental Status: Normal mood and affect. Normal behavior. Normal judgment and thought content.   Urinalysis:      Assessment and Plan:  Pregnancy: G4P1021 at [redacted]w[redacted]d  1. Supervision of high risk pregnancy, antepartum   2. Dichorionic diamniotic twin pregnancy, antepartum   Preterm labor symptoms and general obstetric precautions including but not limited to vaginal bleeding, contractions, leaking of fluid and fetal movement were reviewed in detail with the patient. Please refer to After Visit Summary for other counseling recommendations.  Return in about 2 weeks (around 12/05/2016) for  ROB.   Brock Bad, MD

## 2016-12-05 ENCOUNTER — Other Ambulatory Visit (HOSPITAL_COMMUNITY)
Admission: RE | Admit: 2016-12-05 | Discharge: 2016-12-05 | Disposition: A | Discharge status: Home / Self Care | Payer: Medicaid Other | Source: Ambulatory Visit | Attending: Obstetrics | Admitting: Obstetrics

## 2016-12-05 ENCOUNTER — Encounter: Payer: Self-pay | Admitting: Obstetrics

## 2016-12-05 ENCOUNTER — Ambulatory Visit (INDEPENDENT_AMBULATORY_CARE_PROVIDER_SITE_OTHER): Payer: Medicaid Other | Admitting: Obstetrics

## 2016-12-05 VITALS — BP 122/74 | HR 90 | Wt 204.8 lb

## 2016-12-05 DIAGNOSIS — N760 Acute vaginitis: Secondary | ICD-10-CM | POA: Diagnosis not present

## 2016-12-05 DIAGNOSIS — B9689 Other specified bacterial agents as the cause of diseases classified elsewhere: Secondary | ICD-10-CM | POA: Insufficient documentation

## 2016-12-05 DIAGNOSIS — O0993 Supervision of high risk pregnancy, unspecified, third trimester: Secondary | ICD-10-CM

## 2016-12-05 DIAGNOSIS — O30049 Twin pregnancy, dichorionic/diamniotic, unspecified trimester: Secondary | ICD-10-CM

## 2016-12-05 DIAGNOSIS — O30043 Twin pregnancy, dichorionic/diamniotic, third trimester: Secondary | ICD-10-CM

## 2016-12-05 DIAGNOSIS — O0990 Supervision of high risk pregnancy, unspecified, unspecified trimester: Secondary | ICD-10-CM

## 2016-12-05 MED ORDER — TERCONAZOLE 0.8 % VA CREA: 1.0000 | TOPICAL_CREAM | Freq: Every day | VAGINAL | 0 refills | Status: DC

## 2016-12-05 NOTE — Progress Notes (Signed)
Subjective:  Kathleen Esparza is a 26 y.o. G4P1021 at 6888w1d being seen today for ongoing prenatal care.  She is currently monitored for the following issues for this high-risk pregnancy and has Supervision of other normal pregnancy, antepartum; Acute right-sided low back pain with right-sided sciatica; and Dichorionic diamniotic twin gestation on her problem list.  Patient reports no complaints.  Contractions: Irritability. Vag. Bleeding: None.  Movement: Present. Denies leaking of fluid.   The following portions of the patient's history were reviewed and updated as appropriate: allergies, current medications, past family history, past medical history, past social history, past surgical history and problem list. Problem list updated.  Objective:   Vitals:   12/05/16 1110  BP: 122/74  Pulse: 90  Weight: 204 lb 12.8 oz (92.9 kg)    Fetal Status: Fetal Heart Rate (bpm): 150/140   Movement: Present     General:  Alert, oriented and cooperative. Patient is in no acute distress.  Skin: Skin is warm and dry. No rash noted.   Cardiovascular: Normal heart rate noted  Respiratory: Normal respiratory effort, no problems with respiration noted  Abdomen: Soft, gravid, appropriate for gestational age. Pain/Pressure: Present     Pelvic:  Cervical exam deferred        Extremities: Normal range of motion.  Edema: None  Mental Status: Normal mood and affect. Normal behavior. Normal judgment and thought content.   Urinalysis:      Assessment and Plan:  Pregnancy: G4P1021 at 6088w1d  1. Supervision of high risk pregnancy, antepartum  2. Dichorionic diamniotic twin pregnancy, antepartum   3. Acute vaginitis Rx: - Cervicovaginal ancillary only - Terazol 3 Rx  Preterm labor symptoms and general obstetric precautions including but not limited to vaginal bleeding, contractions, leaking of fluid and fetal movement were reviewed in detail with the patient. Please refer to After Visit Summary for other  counseling recommendations.  Return in about 2 weeks (around 12/19/2016) for ROB.   Brock BadHarper, Charles A, MD

## 2016-12-05 NOTE — Progress Notes (Signed)
Pt c/o vaginal irritation and white thick discharge.

## 2016-12-05 NOTE — Addendum Note (Signed)
Addended by: Coral CeoHARPER, CHARLES A on: 12/05/2016 12:27 PM   Modules accepted: Orders

## 2016-12-06 ENCOUNTER — Other Ambulatory Visit: Payer: Self-pay | Admitting: Obstetrics

## 2016-12-06 DIAGNOSIS — B9689 Other specified bacterial agents as the cause of diseases classified elsewhere: Secondary | ICD-10-CM

## 2016-12-06 DIAGNOSIS — N760 Acute vaginitis: Principal | ICD-10-CM

## 2016-12-06 LAB — CERVICOVAGINAL ANCILLARY ONLY
BACTERIAL VAGINITIS: POSITIVE — AB
Candida vaginitis: NEGATIVE
Trichomonas: NEGATIVE

## 2016-12-06 MED ORDER — SECNIDAZOLE 2 G PO PACK: 1.0000 | PACK | Freq: Once | ORAL | 2 refills | Status: AC

## 2016-12-14 ENCOUNTER — Encounter: Payer: Self-pay | Admitting: Obstetrics

## 2016-12-20 ENCOUNTER — Ambulatory Visit (INDEPENDENT_AMBULATORY_CARE_PROVIDER_SITE_OTHER): Payer: Medicaid Other | Admitting: Obstetrics & Gynecology

## 2016-12-20 ENCOUNTER — Other Ambulatory Visit (HOSPITAL_COMMUNITY): Payer: Self-pay | Admitting: Maternal and Fetal Medicine

## 2016-12-20 ENCOUNTER — Encounter (HOSPITAL_COMMUNITY): Payer: Self-pay

## 2016-12-20 ENCOUNTER — Ambulatory Visit (HOSPITAL_COMMUNITY)
Admission: RE | Admit: 2016-12-20 | Discharge: 2016-12-20 | Disposition: A | Discharge status: Home / Self Care | Payer: Medicaid Other | Source: Ambulatory Visit | Attending: Obstetrics | Admitting: Obstetrics

## 2016-12-20 VITALS — BP 113/73 | HR 102 | Wt 213.0 lb

## 2016-12-20 DIAGNOSIS — Z3A35 35 weeks gestation of pregnancy: Secondary | ICD-10-CM

## 2016-12-20 DIAGNOSIS — O30043 Twin pregnancy, dichorionic/diamniotic, third trimester: Secondary | ICD-10-CM | POA: Insufficient documentation

## 2016-12-20 DIAGNOSIS — O30049 Twin pregnancy, dichorionic/diamniotic, unspecified trimester: Secondary | ICD-10-CM

## 2016-12-20 DIAGNOSIS — O99213 Obesity complicating pregnancy, third trimester: Secondary | ICD-10-CM | POA: Diagnosis not present

## 2016-12-20 DIAGNOSIS — O99210 Obesity complicating pregnancy, unspecified trimester: Secondary | ICD-10-CM

## 2016-12-20 DIAGNOSIS — Z3480 Encounter for supervision of other normal pregnancy, unspecified trimester: Secondary | ICD-10-CM

## 2016-12-20 NOTE — Patient Instructions (Signed)

## 2016-12-20 NOTE — ED Notes (Signed)
Pt reports leaking small amt of watery fluid yesterday x one episode.  Denies any leaking, bleeding or pain today.

## 2016-12-20 NOTE — Progress Notes (Signed)
   PRENATAL VISIT NOTE  Subjective:  Kathleen Esparza is a 26 y.o. G4P1021 at 5219w2d being seen today for ongoing prenatal care.  She is currently monitored for the following issues for this high-risk pregnancy and has Supervision of other normal pregnancy, antepartum; Acute right-sided low back pain with right-sided sciatica; and Dichorionic diamniotic twin gestation on their problem list.  Patient reports no complaints.  Contractions: Irregular. Vag. Bleeding: None.  Movement: Present. Denies leaking of fluid.   The following portions of the patient's history were reviewed and updated as appropriate: allergies, current medications, past family history, past medical history, past social history, past surgical history and problem list. Problem list updated.  Objective:   Vitals:   12/20/16 0929  BP: 113/73  Pulse: (!) 102  Weight: 213 lb (96.6 kg)    Fetal Status: Fetal Heart Rate (bpm): 145/155 Fundal Height: 41 cm Movement: Present     General:  Alert, oriented and cooperative. Patient is in no acute distress.  Skin: Skin is warm and dry. No rash noted.   Cardiovascular: Normal heart rate noted  Respiratory: Normal respiratory effort, no problems with respiration noted  Abdomen: Soft, gravid, appropriate for gestational age.  Pain/Pressure: Present     Pelvic: Cervical exam deferred        Extremities: Normal range of motion.  Edema: None  Mental Status:  Normal mood and affect. Normal behavior. Normal judgment and thought content.   Assessment and Plan:  Pregnancy: G4P1021 at 319w2d  1. Supervision of other normal pregnancy, antepartum Twins, growth [place] today  2. Dichorionic diamniotic twin pregnancy in third trimester Doing well   Preterm labor symptoms and general obstetric precautions including but not limited to vaginal bleeding, contractions, leaking of fluid and fetal movement were reviewed in detail with the patient. Please refer to After Visit Summary for other  counseling recommendations.  Return in about 1 week (around 12/27/2016).   Kathleen DarterJames Idalys Konecny, MD

## 2016-12-23 ENCOUNTER — Inpatient Hospital Stay (HOSPITAL_COMMUNITY)
Admission: AD | Admit: 2016-12-23 | Discharge: 2016-12-23 | Disposition: A | Discharge status: Home / Self Care | Payer: Medicaid Other | Source: Ambulatory Visit | Attending: Obstetrics & Gynecology | Admitting: Obstetrics & Gynecology

## 2016-12-23 ENCOUNTER — Other Ambulatory Visit: Payer: Self-pay

## 2016-12-23 ENCOUNTER — Encounter (HOSPITAL_COMMUNITY): Payer: Self-pay

## 2016-12-23 DIAGNOSIS — Z87891 Personal history of nicotine dependence: Secondary | ICD-10-CM | POA: Diagnosis not present

## 2016-12-23 DIAGNOSIS — O30049 Twin pregnancy, dichorionic/diamniotic, unspecified trimester: Secondary | ICD-10-CM | POA: Diagnosis not present

## 2016-12-23 DIAGNOSIS — Z3A35 35 weeks gestation of pregnancy: Secondary | ICD-10-CM | POA: Insufficient documentation

## 2016-12-23 DIAGNOSIS — O30043 Twin pregnancy, dichorionic/diamniotic, third trimester: Secondary | ICD-10-CM | POA: Diagnosis not present

## 2016-12-23 DIAGNOSIS — O4703 False labor before 37 completed weeks of gestation, third trimester: Secondary | ICD-10-CM

## 2016-12-23 LAB — URINALYSIS, ROUTINE W REFLEX MICROSCOPIC
Bacteria, UA: NONE SEEN
Bilirubin Urine: NEGATIVE
Glucose, UA: NEGATIVE mg/dL
Hgb urine dipstick: NEGATIVE
Ketones, ur: NEGATIVE mg/dL
Nitrite: NEGATIVE
Protein, ur: NEGATIVE mg/dL
Specific Gravity, Urine: 1.008 (ref 1.005–1.030)
pH: 7 (ref 5.0–8.0)

## 2016-12-23 MED ORDER — TERBUTALINE SULFATE 1 MG/ML IJ SOLN
0.2500 mg | Freq: Once | INTRAMUSCULAR | Status: AC
  Administered 2016-12-23: 0.25 mg via SUBCUTANEOUS
  Filled 2016-12-23: qty 1

## 2016-12-23 MED ORDER — ONDANSETRON 8 MG PO TBDP
8.0000 mg | ORAL_TABLET | Freq: Once | ORAL | Status: AC
  Administered 2016-12-23: 8 mg via ORAL
  Filled 2016-12-23: qty 1

## 2016-12-23 MED ORDER — ZOLPIDEM TARTRATE ER 12.5 MG PO TBCR: 12.5000 mg | EXTENDED_RELEASE_TABLET | Freq: Every evening | ORAL | 0 refills | Status: DC | PRN

## 2016-12-23 NOTE — Discharge Instructions (Signed)
Braxton Hicks Contractions °Contractions of the uterus can occur throughout pregnancy, but they are not always a sign that you are in labor. You may have practice contractions called Braxton Hicks contractions. These false labor contractions are sometimes confused with true labor. °What are Braxton Hicks contractions? °Braxton Hicks contractions are tightening movements that occur in the muscles of the uterus before labor. Unlike true labor contractions, these contractions do not result in opening (dilation) and thinning of the cervix. Toward the end of pregnancy (32-34 weeks), Braxton Hicks contractions can happen more often and may become stronger. These contractions are sometimes difficult to tell apart from true labor because they can be very uncomfortable. You should not feel embarrassed if you go to the hospital with false labor. °Sometimes, the only way to tell if you are in true labor is for your health care provider to look for changes in the cervix. The health care provider will do a physical exam and may monitor your contractions. If you are not in true labor, the exam should show that your cervix is not dilating and your water has not broken. °If there are no prenatal problems or other health problems associated with your pregnancy, it is completely safe for you to be sent home with false labor. You may continue to have Braxton Hicks contractions until you go into true labor. °How can I tell the difference between true labor and false labor? °· Differences °? False labor °? Contractions last 30-70 seconds.: Contractions are usually shorter and not as strong as true labor contractions. °? Contractions become very regular.: Contractions are usually irregular. °? Discomfort is usually felt in the top of the uterus, and it spreads to the lower abdomen and low back.: Contractions are often felt in the front of the lower abdomen and in the groin. °? Contractions do not go away with walking.: Contractions may  go away when you walk around or change positions while lying down. °? Contractions usually become more intense and increase in frequency.: Contractions get weaker and are shorter-lasting as time goes on. °? The cervix dilates and gets thinner.: The cervix usually does not dilate or become thin. °Follow these instructions at home: °· Take over-the-counter and prescription medicines only as told by your health care provider. °· Keep up with your usual exercises and follow other instructions from your health care provider. °· Eat and drink lightly if you think you are going into labor. °· If Braxton Hicks contractions are making you uncomfortable: °? Change your position from lying down or resting to walking, or change from walking to resting. °? Sit and rest in a tub of warm water. °? Drink enough fluid to keep your urine clear or pale yellow. Dehydration may cause these contractions. °? Do slow and deep breathing several times an hour. °· Keep all follow-up prenatal visits as told by your health care provider. This is important. °Contact a health care provider if: °· You have a fever. °· You have continuous pain in your abdomen. °Get help right away if: °· Your contractions become stronger, more regular, and closer together. °· You have fluid leaking or gushing from your vagina. °· You pass blood-tinged mucus (bloody show). °· You have bleeding from your vagina. °· You have low back pain that you never had before. °· You feel your baby’s head pushing down and causing pelvic pressure. °· Your baby is not moving inside you as much as it used to. °Summary °· Contractions that occur before labor are   called Braxton Hicks contractions, false labor, or practice contractions.  Braxton Hicks contractions are usually shorter, weaker, farther apart, and less regular than true labor contractions. True labor contractions usually become progressively stronger and regular and they become more frequent.  Manage discomfort from  [facility] by changing position, resting in a warm bath, drinking plenty of water, or practicing deep breathing. This information is not intended to replace advice given to you by your health care provider. Make sure you discuss any questions you have with your health care provider. Document Released: 01/30/2005 Document Revised: 12/20/2015 Document Reviewed: 12/20/2015 Elsevier Interactive Patient Education  2017 [employer] Inc.  Fetal Movement Counts Patient Name: ________________________________________________ Patient Due Date: ____________________ What is a fetal movement count? A fetal movement count is the number of times that you feel your baby move during a certain amount of time. This may also be called a fetal kick count. A fetal movement count is recommended for every pregnant woman. You may be asked to start counting fetal movements as early as week 28 of your pregnancy. Pay attention to when your baby is most active. You may notice your baby's sleep and wake cycles. You may also notice things that make your baby move more. You should do a fetal movement count:  When your baby is normally most active.  At the same time each day.  A good time to count movements is while you are resting, after having something to eat and drink. How do I count fetal movements? 1. Find a quiet, comfortable area. Sit, or lie down on your side. 2. Write down the date, the start time and stop time, and the number of movements that you felt between those two times. Take this information with you to your health care visits. 3. For 2 hours, count kicks, flutters, swishes, rolls, and jabs. You should feel at least 10 movements during 2 hours. 4. You may stop counting after you have felt 10 movements. 5. If you do not feel 10 movements in 2 hours, have something to eat and drink. Then, keep resting and counting for 1 hour. If you feel at least 4 movements during that hour, you may stop  counting. Contact a health care provider if:  You feel fewer than 4 movements in 2 hours.  Your baby is not moving like he or she usually does. Date: ____________ Start time: ____________ Stop time: ____________ Movements: ____________ Date: ____________ Start time: ____________ Stop time: ____________ Movements: ____________ Date: ____________ Start time: ____________ Stop time: ____________ Movements: ____________ Date: ____________ Start time: ____________ Stop time: ____________ Movements: ____________ Date: ____________ Start time: ____________ Stop time: ____________ Movements: ____________ Date: ____________ Start time: ____________ Stop time: ____________ Movements: ____________ Date: ____________ Start time: ____________ Stop time: ____________ Movements: ____________ Date: ____________ Start time: ____________ Stop time: ____________ Movements: ____________ Date: ____________ Start time: ____________ Stop time: ____________ Movements: ____________ This information is not intended to replace advice given to you by your health care provider. Make sure you discuss any questions you have with your health care provider. Document Released: 03/01/2006 Document Revised: 09/29/2015 Document Reviewed: 03/11/2015 Elsevier Interactive Patient Education  2018 [employer]  Preterm Labor and Birth Information Pregnancy normally lasts 39-41 weeks. Preterm labor is when labor starts early. It starts before you have been pregnant for 37 whole weeks. What are the risk factors for preterm labor? Preterm labor is more likely to occur in women who:  Have an infection while pregnant.  Have a cervix that  is short.  Have gone into preterm labor before.  Have had surgery on their cervix.  Are younger than age 26.  Are older than age 26.  Are African American.  Are pregnant with two or more babies.  Take street drugs while pregnant.  Smoke while pregnant.  Do not gain enough weight  while pregnant.  Got pregnant right after another pregnancy.  What are the symptoms of preterm labor? Symptoms of preterm labor include:  Cramps. The cramps may feel like the cramps some women get during their period. The cramps may happen with watery poop (diarrhea).  Pain in the belly (abdomen).  Pain in the lower back.  Regular contractions or tightening. It may feel like your belly is getting tighter.  Pressure in the lower belly that seems to get stronger.  More fluid (discharge) leaking from the vagina. The fluid may be watery or bloody.  Water breaking.  Why is it important to notice signs of preterm labor? Babies who are born early may not be fully developed. They have a higher chance for:  Long-term heart problems.  Long-term lung problems.  Trouble controlling body systems, like breathing.  Bleeding in the brain.  A condition called cerebral palsy.  Learning difficulties.  Death.  These risks are highest for babies who are born before 34 weeks of pregnancy. How is preterm labor treated? Treatment depends on:  How long you were pregnant.  Your condition.  The health of your baby.  Treatment may involve:  Having a stitch (suture) placed in your cervix. When you give birth, your cervix opens so the baby can come out. The stitch keeps the cervix from opening too soon.  Staying at the hospital.  Taking or getting medicines, such as: ? Hormone medicines. ? Medicines to stop contractions. ? Medicines to help the babys lungs develop. ? Medicines to prevent your baby from having cerebral palsy.  What should I do if I am in preterm labor? If you think you are going into labor too soon, call your doctor right away. How can I prevent preterm labor?  Do not use any tobacco products. ? Examples of these are cigarettes, chewing tobacco, and e-cigarettes. ? If you need help quitting, ask your doctor.  Do not use street drugs.  Do not use any medicines  unless you ask your doctor if they are safe for you.  Talk with your doctor before taking any herbal supplements.  Make sure you gain enough weight.  Watch for infection. If you think you might have an infection, get it checked right away.  If you have gone into preterm labor before, tell your doctor. This information is not intended to replace advice given to you by your health care provider. Make sure you discuss any questions you have with your health care provider. Document Released: 04/28/2008 Document Revised: 07/13/2015 Document Reviewed: 06/23/2015 Elsevier Interactive Patient Education  2018 [employer]

## 2016-12-23 NOTE — MAU Provider Note (Signed)
History     CSN: [id number]  Arrival date and time: 12/23/16 0135   First Provider Initiated Contact with Patient 12/23/16 0221      Chief Complaint  Patient presents with  . Contractions   HPI [name] is a 26 y.o. [id number] at 4957w5d who presents to MAU today with complaint of contractions for the last few hours. She has di/di twins. She states contractions q 3-5 minutes x 2 hours prior to arrival. She denies vaginal bleeding or LOF. She took procardia at 0100 with minimal relief. She reports normal fetal movement.   OB History    Gravida Para Term Preterm AB Living   4 1 1   2 1    SAB TAB Ectopic Multiple Live Births     2     1      Past Medical History:  Diagnosis Date  . Medical history non-contributory     Past Surgical History:  Procedure Laterality Date  . INDUCED ABORTION    . TONSILLECTOMY  2016    Family History  Problem Relation Age of Onset  . COPD Maternal Grandmother   . Diabetes Maternal Grandmother   . Other Neg Hx     Social History   Tobacco Use  . Smoking status: Former Smoker    Last attempt to quit: 2017    Years since quitting: 1.8  . Smokeless tobacco: Never Used  Substance Use Topics  . Alcohol use: No    Alcohol/week: 0.0 oz  . Drug use: No    Allergies: No Known Allergies  Medications Prior to Admission  Medication Sig Dispense Refill Last Dose  . cholecalciferol (VITAMIN D) 1000 units tablet Take 1,000 Units by mouth daily.    12/22/2016 at Unknown time  . ferrous sulfate 325 (65 FE) MG tablet Take 1 tablet (325 mg total) by mouth 2 (two) times daily with a meal. 60 tablet 5 12/22/2016 at Unknown time  . folic acid (FOLVITE) 1 MG tablet Take 1 mg by mouth daily.   12/22/2016 at Unknown time  . NIFEdipine (PROCARDIA) 10 MG capsule Take 1 capsule (10 mg total) by mouth every 4 (four) hours as needed. 30 capsule 1 12/23/2016 at 0100  . Prenatal Vit-Fe Fumarate-FA (MULTIVITAMIN-PRENATAL) 27-0.8 MG TABS tablet Take 1 tablet  by mouth daily at 12 noon.   12/22/2016 at Unknown time  . acetaminophen (TYLENOL) 325 MG tablet Take 325 mg by mouth every 6 (six) hours as needed for moderate pain.   Taking  . terconazole (TERAZOL 3) 0.8 % vaginal cream Place 1 applicator vaginally at bedtime. (Patient not taking: Reported on 12/20/2016) 20 g 0 Not Taking    Review of Systems  Gastrointestinal: Positive for abdominal pain.  Genitourinary: Negative for vaginal bleeding and vaginal discharge.   Physical Exam   Blood pressure 118/70, pulse (!) 107, temperature 98.7 F (37.1 C), temperature source Oral, resp. rate 18, height 5\' 4"  (1.626 m), weight 220 lb (99.8 kg), last menstrual period 03/24/2016, SpO2 100 %.  Physical Exam  Nursing note and vitals reviewed. Constitutional: She is oriented to person, place, and time. She appears well-developed and well-nourished. No distress.  HENT:  Head: Normocephalic and atraumatic.  Cardiovascular: Normal rate.  Respiratory: Effort normal.  GI: Soft. She exhibits no distension and no mass. There is no tenderness. There is no rebound and no guarding.  Neurological: She is alert and oriented to person, place, and time.  Skin: Skin is warm and dry. No  erythema.  Psychiatric: She has a normal mood and affect.    Results for orders placed or performed during the hospital encounter of 12/23/16 (from the past 24 hour(s))  Urinalysis, Routine w reflex microscopic     Status: Abnormal   Collection Time: 12/23/16  2:00 AM  Result Value Ref Range   Color, Urine YELLOW YELLOW   APPearance CLEAR CLEAR   Specific Gravity, Urine 1.008 1.005 - 1.030   pH 7.0 5.0 - 8.0   Glucose, UA NEGATIVE NEGATIVE mg/dL   Hgb urine dipstick NEGATIVE NEGATIVE   Bilirubin Urine NEGATIVE NEGATIVE   Ketones, ur NEGATIVE NEGATIVE mg/dL   Protein, ur NEGATIVE NEGATIVE mg/dL   Nitrite NEGATIVE NEGATIVE   Leukocytes, UA TRACE (A) NEGATIVE   RBC / HPF 0-5 0 - 5 RBC/hpf   WBC, UA 0-5 0 - 5 WBC/hpf   Bacteria,  UA NONE SEEN NONE SEEN   Squamous Epithelial / LPF 0-5 (A) NONE SEEN   Mucus PRESENT     Fetal Monitoring: Baseline A: 140 bpm Variability: moderate Accelerations: 15 x 15 Decelerations:  none Baseline B: 135 bpm Variability: moderate Accelerations: 15 x 15 Decelerations: none Contractions: q 3- 4 minutes  MAU Course  Procedures None  MDM UA today  Discussed patient with Dr. Despina Esparza. Recommends 0.25 mcg SQ Terbutaline for contractions.  Terbutaline given. Contractions resolved. Patient no longer having pain.     Assessment and Plan  A: Di/di Twins Preterm contractions, third trimester   P: Discharge home Preterm labor precautions discussed Patient advised to follow-up with [facility] as scheduled for routine prenatal care or sooner PRN Patient may return to MAU as needed or if her condition were to change or worsen   Kathleen Esparza 12/23/2016, 3:43 AM

## 2016-12-23 NOTE — MAU Note (Signed)
Having ctxs for couple hours. Denies LOF or bleeding. Took 2 Procardia about an hour ago but is not helping

## 2016-12-27 ENCOUNTER — Encounter: Payer: Medicaid Other | Admitting: Obstetrics and Gynecology

## 2016-12-27 ENCOUNTER — Other Ambulatory Visit (HOSPITAL_COMMUNITY)
Admission: RE | Admit: 2016-12-27 | Discharge: 2016-12-27 | Disposition: A | Discharge status: Home / Self Care | Payer: Medicaid Other | Source: Ambulatory Visit | Attending: Obstetrics and Gynecology | Admitting: Obstetrics and Gynecology

## 2016-12-27 ENCOUNTER — Encounter: Payer: Self-pay | Admitting: Obstetrics and Gynecology

## 2016-12-27 ENCOUNTER — Ambulatory Visit (INDEPENDENT_AMBULATORY_CARE_PROVIDER_SITE_OTHER): Payer: Medicaid Other | Admitting: Obstetrics and Gynecology

## 2016-12-27 VITALS — BP 116/77 | HR 81 | Wt 220.0 lb

## 2016-12-27 DIAGNOSIS — Z3480 Encounter for supervision of other normal pregnancy, unspecified trimester: Secondary | ICD-10-CM | POA: Insufficient documentation

## 2016-12-27 DIAGNOSIS — Z3483 Encounter for supervision of other normal pregnancy, third trimester: Secondary | ICD-10-CM

## 2016-12-27 DIAGNOSIS — Z3A00 Weeks of gestation of pregnancy not specified: Secondary | ICD-10-CM | POA: Insufficient documentation

## 2016-12-27 DIAGNOSIS — Z113 Encounter for screening for infections with a predominantly sexual mode of transmission: Secondary | ICD-10-CM | POA: Diagnosis not present

## 2016-12-27 DIAGNOSIS — O30043 Twin pregnancy, dichorionic/diamniotic, third trimester: Secondary | ICD-10-CM

## 2016-12-27 LAB — OB RESULTS CONSOLE GBS: STREP GROUP B AG: POSITIVE

## 2016-12-27 LAB — OB RESULTS CONSOLE GC/CHLAMYDIA: GC PROBE AMP, GENITAL: NEGATIVE

## 2016-12-27 NOTE — Progress Notes (Signed)
   PRENATAL VISIT NOTE  Subjective:  Kathleen Esparza is a 26 y.o. G4P1021 at 2242w2d being seen today for ongoing prenatal care.  She is currently monitored for the following issues for this high-risk pregnancy and has Supervision of other normal pregnancy, antepartum; Acute right-sided low back pain with right-sided sciatica; and Dichorionic diamniotic twin gestation on their problem list.  Patient reports no complaints.  Contractions: Regular. Vag. Bleeding: None.  Movement: Present. Denies leaking of fluid.   The following portions of the patient's history were reviewed and updated as appropriate: allergies, current medications, past family history, past medical history, past social history, past surgical history and problem list. Problem list updated.  Objective:   Vitals:   12/27/16 1447  BP: 116/77  Pulse: 81  Weight: 220 lb (99.8 kg)    Fetal Status:     Movement: Present     General:  Alert, oriented and cooperative. Patient is in no acute distress.  Skin: Skin is warm and dry. No rash noted.   Cardiovascular: Normal heart rate noted  Respiratory: Normal respiratory effort, no problems with respiration noted  Abdomen: Soft, gravid, appropriate for gestational age.  Pain/Pressure: Present     Pelvic: Cervical exam performed Dilation: Closed Effacement (%): Thick Station: Ballotable  Extremities: Normal range of motion.  Edema: Trace  Mental Status:  Normal mood and affect. Normal behavior. Normal judgment and thought content.   Assessment and Plan:  Pregnancy: G4P1021 at 8642w2d  1. Supervision of other normal pregnancy, antepartum Patient is doing well Cultures collected Plan for IOL at 38 weeks - GC/Chlamydia probe amp (L[facility] - Culture, beta strep (group b only)  2. Dichorionic diamniotic twin pregnancy in third trimester Normal growth on 11/7 Cephalic/cephalic Will plan for IOL at 38 weeks NST reviewed and reactive x 2 wirh baseline 125/130, mod  variability, +accels, no decels BPP scheduled for later this week  Preterm labor symptoms and general obstetric precautions including but not limited to vaginal bleeding, contractions, leaking of fluid and fetal movement were reviewed in detail with the patient. Please refer to After Visit Summary for other counseling recommendations.  Return in about 1 week (around 01/03/2017) for ROB.   Catalina AntiguaPeggy Estalee Mccandlish, MD

## 2016-12-28 ENCOUNTER — Telehealth (HOSPITAL_COMMUNITY): Payer: Self-pay | Admitting: *Deleted

## 2016-12-28 ENCOUNTER — Encounter (HOSPITAL_COMMUNITY): Payer: Self-pay | Admitting: *Deleted

## 2016-12-28 LAB — GC/CHLAMYDIA PROBE AMP (~~LOC~~) NOT AT ARMC
Chlamydia: NEGATIVE
Neisseria Gonorrhea: NEGATIVE

## 2016-12-28 NOTE — Telephone Encounter (Signed)
Preadmission screen  

## 2016-12-29 ENCOUNTER — Encounter (HOSPITAL_COMMUNITY): Payer: Self-pay

## 2016-12-29 ENCOUNTER — Other Ambulatory Visit: Payer: Self-pay | Admitting: Obstetrics and Gynecology

## 2016-12-29 ENCOUNTER — Ambulatory Visit (HOSPITAL_COMMUNITY)
Admission: RE | Admit: 2016-12-29 | Discharge: 2016-12-29 | Disposition: A | Discharge status: Home / Self Care | Payer: Medicaid Other | Source: Ambulatory Visit | Attending: Obstetrics and Gynecology | Admitting: Obstetrics and Gynecology

## 2016-12-29 DIAGNOSIS — Z6832 Body mass index (BMI) 32.0-32.9, adult: Secondary | ICD-10-CM | POA: Insufficient documentation

## 2016-12-29 DIAGNOSIS — O30043 Twin pregnancy, dichorionic/diamniotic, third trimester: Secondary | ICD-10-CM

## 2016-12-29 DIAGNOSIS — Z3A36 36 weeks gestation of pregnancy: Secondary | ICD-10-CM

## 2016-12-29 DIAGNOSIS — O321XX2 Maternal care for breech presentation, fetus 2: Secondary | ICD-10-CM | POA: Insufficient documentation

## 2016-12-29 DIAGNOSIS — E669 Obesity, unspecified: Secondary | ICD-10-CM | POA: Diagnosis not present

## 2016-12-29 DIAGNOSIS — O99213 Obesity complicating pregnancy, third trimester: Secondary | ICD-10-CM | POA: Insufficient documentation

## 2016-12-29 DIAGNOSIS — O365930 Maternal care for other known or suspected poor fetal growth, third trimester, not applicable or unspecified: Secondary | ICD-10-CM | POA: Diagnosis not present

## 2016-12-29 NOTE — ED Notes (Signed)
Pt reports watery discharge x 1 week requiring changing her panties twice daily.  Denies vaginal bleeding.  Good fetal movement.

## 2017-01-01 ENCOUNTER — Encounter: Payer: Self-pay | Admitting: Obstetrics and Gynecology

## 2017-01-01 DIAGNOSIS — O99820 Streptococcus B carrier state complicating pregnancy: Secondary | ICD-10-CM | POA: Insufficient documentation

## 2017-01-01 LAB — CULTURE, BETA STREP (GROUP B ONLY): Strep Gp B Culture: POSITIVE — AB

## 2017-01-03 ENCOUNTER — Encounter: Payer: Medicaid Other | Admitting: Obstetrics & Gynecology

## 2017-01-03 ENCOUNTER — Encounter: Payer: Self-pay | Admitting: Obstetrics

## 2017-01-03 ENCOUNTER — Ambulatory Visit (INDEPENDENT_AMBULATORY_CARE_PROVIDER_SITE_OTHER): Payer: Medicaid Other | Admitting: Obstetrics

## 2017-01-03 VITALS — BP 124/74 | HR 78 | Wt 223.9 lb

## 2017-01-03 DIAGNOSIS — O0993 Supervision of high risk pregnancy, unspecified, third trimester: Secondary | ICD-10-CM

## 2017-01-03 DIAGNOSIS — O30049 Twin pregnancy, dichorionic/diamniotic, unspecified trimester: Secondary | ICD-10-CM

## 2017-01-03 DIAGNOSIS — O0990 Supervision of high risk pregnancy, unspecified, unspecified trimester: Secondary | ICD-10-CM

## 2017-01-03 DIAGNOSIS — O30043 Twin pregnancy, dichorionic/diamniotic, third trimester: Secondary | ICD-10-CM

## 2017-01-03 NOTE — Progress Notes (Signed)
Subjective:  Kathleen Esparza is a 26 y.o. [phone] at 9223w2d being seen today for ongoing prenatal care.  She is currently monitored for the following issues for this high-risk pregnancy and has Supervision of other normal pregnancy, antepartum; Acute right-sided low back pain with right-sided sciatica; Dichorionic diamniotic twin gestation; and GBS (group B Streptococcus carrier), +RV culture, currently pregnant on their problem list.  Patient reports no complaints.  Contractions: Irregular. Vag. Bleeding: None.  Movement: Present. Denies leaking of fluid.   The following portions of the patient's history were reviewed and updated as appropriate: allergies, current medications, past family history, past medical history, past social history, past surgical history and problem list. Problem list updated.  Objective:   Vitals:   01/03/17 1545  BP: 124/74  Pulse: 78  Weight: 223 lb 14.4 oz (101.6 kg)    Fetal Status: Fetal Heart Rate (bpm): NST / R   Movement: Present     General:  Alert, oriented and cooperative. Patient is in no acute distress.  Skin: Skin is warm and dry. No rash noted.   Cardiovascular: Normal heart rate noted  Respiratory: Normal respiratory effort, no problems with respiration noted  Abdomen: Soft, gravid, appropriate for gestational age. Pain/Pressure: Present     Pelvic:  Cervical exam deferred        Extremities: Normal range of motion.  Edema: Mild pitting, slight indentation  Mental Status: Normal mood and affect. Normal behavior. Normal judgment and thought content.   Urinalysis:      NST's::  Reactive x 2.  Good variability x 2.  15x15 accels x 2.  No decels.  No UC's.  Assessment and Plan:  Pregnancy: G4P1021 at 5423w2d  1. Supervision of high risk pregnancy, antepartum   2. Dichorionic diamniotic twin pregnancy, antepartum - IOL 01-08-17  Preterm labor symptoms and general obstetric precautions including but not limited to vaginal bleeding,  contractions, leaking of fluid and fetal movement were reviewed in detail with the patient. Please refer to After Visit Summary for other counseling recommendations.     Brock BadHarper, Charles A, MD

## 2017-01-05 ENCOUNTER — Inpatient Hospital Stay (HOSPITAL_COMMUNITY)
Admission: AD | Admit: 2017-01-05 | Discharge: 2017-01-08 | DRG: 807 | Disposition: A | Discharge status: Home / Self Care | Payer: Medicaid Other | Source: Ambulatory Visit | Attending: Obstetrics and Gynecology | Admitting: Obstetrics and Gynecology

## 2017-01-05 ENCOUNTER — Encounter (HOSPITAL_COMMUNITY): Payer: Self-pay | Admitting: Anesthesiology

## 2017-01-05 ENCOUNTER — Other Ambulatory Visit: Payer: Self-pay | Admitting: Obstetrics and Gynecology

## 2017-01-05 ENCOUNTER — Ambulatory Visit (HOSPITAL_COMMUNITY)
Admission: RE | Admit: 2017-01-05 | Discharge: 2017-01-05 | Disposition: A | Discharge status: Home / Self Care | Payer: Medicaid Other | Source: Ambulatory Visit | Attending: Obstetrics and Gynecology | Admitting: Obstetrics and Gynecology

## 2017-01-05 ENCOUNTER — Encounter (HOSPITAL_COMMUNITY): Payer: Self-pay

## 2017-01-05 ENCOUNTER — Encounter (HOSPITAL_COMMUNITY): Payer: Self-pay | Admitting: *Deleted

## 2017-01-05 DIAGNOSIS — O283 Abnormal ultrasonic finding on antenatal screening of mother: Secondary | ICD-10-CM

## 2017-01-05 DIAGNOSIS — Z23 Encounter for immunization: Secondary | ICD-10-CM | POA: Diagnosis not present

## 2017-01-05 DIAGNOSIS — Z880 Allergy status to penicillin: Secondary | ICD-10-CM | POA: Diagnosis not present

## 2017-01-05 DIAGNOSIS — Z3490 Encounter for supervision of normal pregnancy, unspecified, unspecified trimester: Secondary | ICD-10-CM | POA: Diagnosis present

## 2017-01-05 DIAGNOSIS — O99820 Streptococcus B carrier state complicating pregnancy: Secondary | ICD-10-CM

## 2017-01-05 DIAGNOSIS — O30043 Twin pregnancy, dichorionic/diamniotic, third trimester: Secondary | ICD-10-CM

## 2017-01-05 DIAGNOSIS — Z87891 Personal history of nicotine dependence: Secondary | ICD-10-CM | POA: Diagnosis not present

## 2017-01-05 DIAGNOSIS — O99213 Obesity complicating pregnancy, third trimester: Secondary | ICD-10-CM

## 2017-01-05 DIAGNOSIS — O99824 Streptococcus B carrier state complicating childbirth: Secondary | ICD-10-CM | POA: Diagnosis present

## 2017-01-05 DIAGNOSIS — Z3A37 37 weeks gestation of pregnancy: Secondary | ICD-10-CM

## 2017-01-05 LAB — CBC
HCT: 34.6 % — ABNORMAL LOW (ref 36.0–46.0)
HEMOGLOBIN: 11.9 g/dL — AB (ref 12.0–15.0)
MCH: 31.9 pg (ref 26.0–34.0)
MCHC: 34.4 g/dL (ref 30.0–36.0)
MCV: 92.8 fL (ref 78.0–100.0)
Platelets: 251 10*3/uL (ref 150–400)
RBC: 3.73 MIL/uL — ABNORMAL LOW (ref 3.87–5.11)
RDW: 13.7 % (ref 11.5–15.5)
WBC: 11.9 10*3/uL — ABNORMAL HIGH (ref 4.0–10.5)

## 2017-01-05 LAB — TYPE AND SCREEN
ABO/RH(D): O POS
ANTIBODY SCREEN: NEGATIVE

## 2017-01-05 MED ORDER — DIPHENHYDRAMINE HCL 50 MG/ML IJ SOLN: 12.5000 mg | INTRAMUSCULAR | Status: DC | PRN

## 2017-01-05 MED ORDER — LACTATED RINGERS IV SOLN
500.0000 mL | Freq: Once | INTRAVENOUS | Status: AC
  Administered 2017-01-06: 500 mL via INTRAVENOUS

## 2017-01-05 MED ORDER — MISOPROSTOL 25 MCG QUARTER TABLET
25.0000 ug | ORAL_TABLET | ORAL | Status: DC | PRN
  Filled 2017-01-05: qty 1

## 2017-01-05 MED ORDER — PHENYLEPHRINE 40 MCG/ML (10ML) SYRINGE FOR IV PUSH (FOR BLOOD PRESSURE SUPPORT)
80.0000 ug | PREFILLED_SYRINGE | INTRAVENOUS | Status: DC | PRN
  Filled 2017-01-05: qty 5

## 2017-01-05 MED ORDER — LIDOCAINE HCL (PF) 1 % IJ SOLN
30.0000 mL | INTRAMUSCULAR | Status: DC | PRN
  Filled 2017-01-05: qty 30

## 2017-01-05 MED ORDER — EPHEDRINE 5 MG/ML INJ
10.0000 mg | INTRAVENOUS | Status: DC | PRN
  Filled 2017-01-05: qty 2

## 2017-01-05 MED ORDER — FENTANYL 2.5 MCG/ML BUPIVACAINE 1/10 % EPIDURAL INFUSION (WH - ANES)
14.0000 mL/h | INTRAMUSCULAR | Status: DC | PRN
  Administered 2017-01-06: 14 mL/h via EPIDURAL
  Filled 2017-01-05: qty 100

## 2017-01-05 MED ORDER — ONDANSETRON HCL 4 MG/2ML IJ SOLN: 4.0000 mg | Freq: Four times a day (QID) | INTRAMUSCULAR | Status: DC | PRN

## 2017-01-05 MED ORDER — TERBUTALINE SULFATE 1 MG/ML IJ SOLN
0.2500 mg | Freq: Once | INTRAMUSCULAR | Status: DC | PRN
  Filled 2017-01-05: qty 1

## 2017-01-05 MED ORDER — ACETAMINOPHEN 325 MG PO TABS
650.0000 mg | ORAL_TABLET | ORAL | Status: DC | PRN
  Filled 2017-01-05 (×2): qty 2

## 2017-01-05 MED ORDER — LACTATED RINGERS IV SOLN: 500.0000 mL | INTRAVENOUS | Status: DC | PRN

## 2017-01-05 MED ORDER — OXYTOCIN 40 UNITS IN LACTATED RINGERS INFUSION - SIMPLE MED
1.0000 m[IU]/min | INTRAVENOUS | Status: DC
  Administered 2017-01-05: 2 m[IU]/min via INTRAVENOUS
  Filled 2017-01-05: qty 1000

## 2017-01-05 MED ORDER — FLEET ENEMA 7-19 GM/118ML RE ENEM: 1.0000 | ENEMA | RECTAL | Status: DC | PRN

## 2017-01-05 MED ORDER — OXYCODONE-ACETAMINOPHEN 5-325 MG PO TABS
1.0000 | ORAL_TABLET | ORAL | Status: DC | PRN
Start: 2017-01-05 — End: 2017-01-08
  Administered 2017-01-06 – 2017-01-07 (×3): 1 via ORAL
  Filled 2017-01-05 (×3): qty 1

## 2017-01-05 MED ORDER — OXYTOCIN 40 UNITS IN LACTATED RINGERS INFUSION - SIMPLE MED: 2.5000 [IU]/h | INTRAVENOUS | Status: DC

## 2017-01-05 MED ORDER — SOD CITRATE-CITRIC ACID 500-334 MG/5ML PO SOLN: 30.0000 mL | ORAL | Status: DC | PRN

## 2017-01-05 MED ORDER — FENTANYL CITRATE (PF) 100 MCG/2ML IJ SOLN
50.0000 ug | INTRAMUSCULAR | Status: DC | PRN
  Administered 2017-01-05: 50 ug via INTRAVENOUS

## 2017-01-05 MED ORDER — PENICILLIN G POT IN DEXTROSE 60000 UNIT/ML IV SOLN
3.0000 10*6.[IU] | INTRAVENOUS | Status: DC
  Administered 2017-01-05 – 2017-01-06 (×2): 3 10*6.[IU] via INTRAVENOUS
  Filled 2017-01-05 (×5): qty 50

## 2017-01-05 MED ORDER — PHENYLEPHRINE 40 MCG/ML (10ML) SYRINGE FOR IV PUSH (FOR BLOOD PRESSURE SUPPORT)
80.0000 ug | PREFILLED_SYRINGE | INTRAVENOUS | Status: DC | PRN
Start: 2017-01-05 — End: 2017-01-08
  Filled 2017-01-05: qty 5

## 2017-01-05 MED ORDER — OXYTOCIN BOLUS FROM INFUSION
500.0000 mL | Freq: Once | INTRAVENOUS | Status: AC
  Administered 2017-01-06: 500 mL via INTRAVENOUS

## 2017-01-05 MED ORDER — FENTANYL CITRATE (PF) 100 MCG/2ML IJ SOLN
INTRAMUSCULAR | Status: AC
  Administered 2017-01-05: 50 ug via INTRAVENOUS
  Filled 2017-01-05: qty 2

## 2017-01-05 MED ORDER — LACTATED RINGERS IV SOLN
INTRAVENOUS | Status: DC
  Administered 2017-01-05: 17:00:00 via INTRAVENOUS

## 2017-01-05 MED ORDER — OXYCODONE-ACETAMINOPHEN 5-325 MG PO TABS: 2.0000 | ORAL_TABLET | ORAL | Status: DC | PRN

## 2017-01-05 MED ORDER — PENICILLIN G POTASSIUM 5000000 UNITS IJ SOLR
5.0000 10*6.[IU] | Freq: Once | INTRAVENOUS | Status: AC
  Administered 2017-01-05: 5 10*6.[IU] via INTRAVENOUS
  Filled 2017-01-05: qty 5

## 2017-01-05 NOTE — Progress Notes (Addendum)
Dr. Ezzard StandingNewman gave report to Dr. Jolayne Pantheronstant who accepted patient and assumed care. Report of baby A  BPP "4"/8 given to Laurell Josephsheryl Anderson, RN, CN in MAU. Patient taken to MAU for further EFM.

## 2017-01-05 NOTE — Progress Notes (Signed)
Kathleen Esparza is a 26 y.o. [id number] at 7783w4d.  Subjective: Patient was comfortable and thought pain was manageable even prior to pain meds being administered  Objective: BP 121/76   Pulse 76   Temp 99.2 F (37.3 C) (Oral)   Resp 18   Ht 5\' 4"  (1.626 m)   Wt 223 lb (101.2 kg)   LMP 03/24/2016 (Approximate) Comment: implanon removed in Feb, never had a cycle  SpO2 98%   BMI 38.28 kg/m    FHT:  FHR BABY A: 145 bpm, variability: appropriate,  accelerations:  15x15,  decelerations:  n/a  FHR BABY B: 150 bpm, variability: appropriate,  accelerations:  15x15,  decelerations:  n/a UC:   Q 2-6 minutes, 60-90sec Dilation: 4 Effacement (%): 70 Station: -3 Presentation: Vertex Exam by:: Misty StanleyLisa, CNM   Labs: Results for orders placed or performed during the hospital encounter of 01/05/17 (from the past 24 hour(s))  CBC     Status: Abnormal   Collection Time: 01/05/17  4:23 PM  Result Value Ref Range   WBC 11.9 (H) 4.0 - 10.5 K/uL   RBC 3.73 (L) 3.87 - 5.11 MIL/uL   Hemoglobin 11.9 (L) 12.0 - 15.0 g/dL   HCT 19.134.6 (L) 47.836.0 - 29.546.0 %   MCV 92.8 78.0 - 100.0 fL   MCH 31.9 26.0 - 34.0 pg   MCHC 34.4 30.0 - 36.0 g/dL   RDW 62.113.7 30.811.5 - 65.715.5 %   Platelets 251 150 - 400 K/uL  Type and screen [facility]     Status: None   Collection Time: 01/05/17  4:23 PM  Result Value Ref Range   ABO/RH(D) O POS    Antibody Screen NEG    Sample Expiration 01/08/2017     Assessment / Plan: 7383w4d week IUP Labor: expectant management, pitocin Fetal Wellbeing:  Category 1 on FHM, concerning BPP prior for Baby A @4 /8 Pain Control:  Tylenol/percocet prn Anticipated MOD:  SVD  Marthenia RollingBland, Theoden Mauch, DO 01/05/2017 7:02 PM

## 2017-01-05 NOTE — Anesthesia Pain Management Evaluation Note (Signed)
  CRNA Pain Management Visit Note  Patient: Kathleen Esparza, 26 y.o., female  "[facility]. We have an anesthesia team available at all times to provide care throughout the hospital, including epidural management and anesthesia for C-section. I don't know your plan for the delivery whether it a natural birth, water birth, IV sedation, nitrous supplementation, doula or epidural, but we want to meet your pain goals."   1.Was your pain managed to your expectations on prior hospitalizations?   Yes   2.What is your expectation for pain management during this hospitalization?     Epidural and IV pain meds  3.How can we help you reach that goal? Be available  Record the patient's initial score and the patient's pain goal.   Pain: 5  Pain Goal: 5 [facility] wants you to be able to say your pain was always managed very well.  [facility] Center[facility]ERRITT,Kathleen Esparza 01/05/2017

## 2017-01-05 NOTE — MAU Note (Signed)
Pt. Sent to hospital by Dr. Jolayne Pantheronstant for induction, Twin A BPP 4/8. Pt. Denies sudden gush of fluid, nor vaginal bleeding. Pt. Placed on EFM Twin A 130s, Twin B 120s. Toco applied - contraction palpated.

## 2017-01-05 NOTE — MAU Provider Note (Signed)
Chief Complaint:  No chief complaint on file.   None     HPI: Kathleen Esparza is a 26 y.o. [id number] at [redacted]w[redacted]d with di/di twins pregnancy who presents to maternity admissions sent from MFM after scheduled BPP today with Baby A receiving a 4/8.  She also reports intermittent cramping, becoming more painful since onset last night.  She has not tried any treatments, nothing makes her pain better or worse. She is feeling movement for both babies.  There are no other associated symptoms.   She reports good fetal movement, denies LOF, vaginal bleeding, vaginal itching/burning, urinary symptoms, h/a, dizziness, n/v, or fever/chills.    HPI  Past Medical History: Past Medical History:  Diagnosis Date  . Medical history non-contributory     Past obstetric history: OB History  Gravida Para Term Preterm AB Living  4 1 1   2 1   SAB TAB Ectopic Multiple Live Births    2     1    # Outcome Date GA Lbr Len/2nd Weight Sex Delivery Anes PTL Lv  4 Current           3 TAB 2015          2 Term 04/20/09 [redacted]w[redacted]d   F Vag-Spont EPI  LIV  1 TAB               Past Surgical History: Past Surgical History:  Procedure Laterality Date  . INDUCED ABORTION    . TONSILLECTOMY  2016    Family History: Family History  Problem Relation Age of Onset  . COPD Maternal Grandmother   . Diabetes Maternal Grandmother   . Hypertension Maternal Grandmother   . Other Neg Hx     Social History: Social History   Tobacco Use  . Smoking status: Former Smoker    Last attempt to quit: 2017    Years since quitting: 1.8  . Smokeless tobacco: Never Used  Substance Use Topics  . Alcohol use: No    Alcohol/week: 0.0 oz  . Drug use: No    Allergies: No Known Allergies  Meds:  Medications Prior to Admission  Medication Sig Dispense Refill Last Dose  . acetaminophen (TYLENOL) 325 MG tablet Take 325 mg by mouth every 6 (six) hours as needed for moderate pain.   Taking  . cholecalciferol (VITAMIN D) 1000 units  tablet Take 1,000 Units by mouth daily.    Taking  . ferrous sulfate 325 (65 FE) MG tablet Take 1 tablet (325 mg total) by mouth 2 (two) times daily with a meal. 60 tablet 5 Taking  . folic acid (FOLVITE) 1 MG tablet Take 1 mg by mouth daily.   Taking  . Prenatal Vit-Fe Fumarate-FA (MULTIVITAMIN-PRENATAL) 27-0.8 MG TABS tablet Take 1 tablet by mouth daily at 12 noon.   Taking  . zolpidem (AMBIEN CR) 12.5 MG CR tablet Take 1 tablet (12.5 mg total) at bedtime as needed by mouth for sleep. 2 tablet 0 Taking    ROS:  Review of Systems  Constitutional: Negative for chills, fatigue and fever.  Eyes: Negative for visual disturbance.  Respiratory: Negative for shortness of breath.   Cardiovascular: Negative for chest pain.  Gastrointestinal: Positive for abdominal pain. Negative for nausea and vomiting.  Genitourinary: Negative for difficulty urinating, dysuria, flank pain, pelvic pain, vaginal bleeding, vaginal discharge and vaginal pain.  Musculoskeletal: Positive for back pain.  Neurological: Negative for dizziness and headaches.  Psychiatric/Behavioral: Negative.      I have reviewed patient's  Past Medical Hx, Surgical Hx, Family Hx, Social Hx, medications and allergies.   Physical Exam   Patient Vitals for the past 24 hrs:  BP Pulse SpO2 Height Weight  01/05/17 1501 124/89 75 - - -  01/05/17 1457 - - 100 % 5\' 4"  (1.626 m) 223 lb (101.2 kg)   Constitutional: Well-developed, well-nourished female in no acute distress.  Cardiovascular: normal rate Respiratory: normal effort GI: Abd soft, non-tender, gravid appropriate for gestational age.  MS: Extremities nontender, no edema, normal ROM Neurologic: Alert and oriented x 4.  GU: Neg CVAT.   Dilation: 4 Effacement (%): 70 Station: -3 Presentation: Vertex Exam by:: Misty Stanley, CNM   FHT Baby A:  Baseline 135 , moderate variability, accelerations present, no decelerations FHT Baby B:  Baseline 125 , moderate variability, accelerations  present, no decelerations Contractions: q 2-4 mins, mild to palpation   Labs: No results found for this or any previous visit (from the past 24 hour(s)). O/Positive/-- (05/23 1638)  Imaging:  [place] Mfm Fetal Bpp Wo Non Stress  Result Date: 01/05/2017 ----------------------------------------------------------------------  OBSTETRICS REPORT                      (Signed Final 01/05/2017 02:34 pm) ---------------------------------------------------------------------- Patient Info  ID #:       [id number]                          D.O.B.:  [birth year] (26 yrs)  Name:       Kathleen Esparza                 Visit Date: 01/05/2017 01:30 pm ---------------------------------------------------------------------- Performed By  Performed By:     Lenise Arena        Ref. Address:     [address]                                                             [address]                                                             [place]                                                             [address]  Attending:        Charlsie Merles MD  Secondary Phy.:   MAU Nursing-                                                             MAU/Triage  Referred By:      Center for             Location:         [facility]                    Healthcare -                    [place]                    ) ---------------------------------------------------------------------- Orders   #  Description                                 Code   1  [place] MFM FETAL BPP WO NON STRESS              76819.01   2  [place] MFM FETAL BPP WO NST ADDL                [address].6      GESTATION  ----------------------------------------------------------------------   #  Ordered By               Order #        Accession #    Episode #   1  PEGGY CONSTANT           [id number]      [id number]     [id number]   2  PEGGY CONSTANT            [id number]      [id number]     [id number]  ---------------------------------------------------------------------- Indications   [redacted] weeks gestation of pregnancy                Z3A.37   Twin pregnancy, di/di, third trimester         O30.043   Obesity complicating pregnancy, third          O99.213   trimester (pre-pregnancy BMI 30.6)  ---------------------------------------------------------------------- OB History  Blood Type:            Height:  5'4"   Weight (lb):  192       BMI:  32.95  Gravidity:    3         Term:   1        Prem:   0        SAB:   0  TOP:          1       Ectopic:  0        Living: 1 ---------------------------------------------------------------------- Fetal Evaluation (Fetus A)  Num Of Fetuses:     2  Fetal Heart         153  Rate(bpm):  Cardiac Activity:   Observed  Fetal Lie:          Right Fetus  Presentation:  Cephalic  Amniotic Fluid  AFI FV:      Subjectively within normal limits                              Largest Pocket(cm)                              7.25 ---------------------------------------------------------------------- Biophysical Evaluation (Fetus A)  Amniotic F.V:   Within normal limits       F. Tone:        Not Observed  F. Movement:    Not Observed               Score:          4/8  F. Breathing:   Observed ---------------------------------------------------------------------- Gestational Age (Fetus A)  LMP:           41w 0d        Date:  03/24/16                 EDD:   12/29/16  Best:          37w 4d     Det. ByMarcella Dubs         EDD:   01/22/17                                      (06/05/16) ---------------------------------------------------------------------- Anatomy (Fetus A)  Stomach:               Appears normal, left   Bladder:                Appears normal                         sided ---------------------------------------------------------------------- Fetal Evaluation (Fetus B)  Num Of Fetuses:     2  Fetal Heart         128  Rate(bpm):  Cardiac  Activity:   Observed  Fetal Lie:          Left Fetus  Presentation:       Cephalic  Amniotic Fluid  AFI FV:      Subjectively within normal limits                              Largest Pocket(cm)                              4.81 ---------------------------------------------------------------------- Biophysical Evaluation (Fetus B)  Amniotic F.V:   Within normal limits       F. Tone:        Observed  F. Movement:    Observed                   Score:          8/8  F. Breathing:   Observed ---------------------------------------------------------------------- Gestational Age (Fetus B)  LMP:           41w 0d        Date:  03/24/16                 EDD:   12/29/16  Best:  37w 4d     Det. ByMarcella Dubs         EDD:   01/22/17                                      (06/05/16) ---------------------------------------------------------------------- Anatomy (Fetus B)  Stomach:               Appears normal, left   Bladder:                Appears normal                         sided ---------------------------------------------------------------------- Impression  Dichorionic/diamniotic twin pregnancy at 37+4 weeks  Cephalic/cephalic presentation  Normal amniotic fluid volume x 2  BPP 8/8 for twin B  BPP 4/8 for twin A, with no movement or tone ---------------------------------------------------------------------- Recommendations  Given BPP score for A and EGA>37 weeks, would  recommend admission and delivery (was scheduled for  induction on 11/26) ----------------------------------------------------------------------                 Charlsie Merles, MD Electronically Signed Final Report   01/05/2017 02:34 pm ----------------------------------------------------------------------  [place] Mfm Fetal Bpp Wo Non Stress  Result Date: 12/29/2016 ----------------------------------------------------------------------  OBSTETRICS REPORT                      (Signed Final 12/29/2016 01:50 pm)  ---------------------------------------------------------------------- Patient Info  ID #:       [id number]                          D.O.B.:  February 13, 1991 (26 yrs)  Name:       MARNEY TRELOAR Leiter                 Visit Date: 12/29/2016 11:10 am ---------------------------------------------------------------------- Performed By  Performed By:     Vivien Rota        Ref. Address:     [address]                                                             [address]                                                             [place]  [address]  Attending:        Particia Nearing MD       Location:         [facility]  Referred By:      Center for                    [facility]                    Healthcare -                    [place]([facility]                    ) ---------------------------------------------------------------------- Orders   #  Description                                 Code   1  [place] MFM FETAL BPP WO NON STRESS              76819.01   2  [place] MFM FETAL BPP WO NST ADDL                60454.0      GESTATION  ----------------------------------------------------------------------   #  Ordered By               Order #        Accession #    Episode #   1  PEGGY CONSTANT           [id number]      [id number]     [id number]   2  PEGGY CONSTANT           [id number]      [id number]     [id number]  ---------------------------------------------------------------------- Indications   [redacted] weeks gestation of pregnancy                Z3A.36   Twin pregnancy, di/di, third trimester         O30.043   Obesity complicating pregnancy, third          O99.213   trimester (pre-pregnancy BMI 30.6)  ---------------------------------------------------------------------- OB History  Blood Type:            Height:  5'4"   Weight (lb):  192       BMI:  32.95  Gravidity:    3         Term:   1         Prem:   0        SAB:   0  TOP:          1       Ectopic:  0        Living: 1 ---------------------------------------------------------------------- Fetal Evaluation (Fetus A)  Num Of Fetuses:     2  Fetal Heart         130  Rate(bpm):  Cardiac Activity:   Observed  Fetal Lie:          Right Fetus  Presentation:       Cephalic  Amniotic Fluid  AFI FV:      Subjectively within normal limits                              Largest Pocket(cm)  5.07 ---------------------------------------------------------------------- Biophysical Evaluation (Fetus A)  Amniotic F.V:   Pocket => 2 cm two         F. Tone:        Observed                  planes  F. Movement:    Observed                   Score:          8/8  F. Breathing:   Observed ---------------------------------------------------------------------- Gestational Age (Fetus A)  LMP:           40w 0d        Date:  03/24/16                 EDD:   12/29/16  Best:          36w 4d     Det. ByMarcella Dubs         EDD:   01/22/17                                      (06/05/16) ---------------------------------------------------------------------- Fetal Evaluation (Fetus B)  Num Of Fetuses:     2  Fetal Heart         129  Rate(bpm):  Cardiac Activity:   Observed  Fetal Lie:          Left Fetus  Presentation:       Breech  Amniotic Fluid  AFI FV:      Subjectively within normal limits                              Largest Pocket(cm)                              7.70 ---------------------------------------------------------------------- Biophysical Evaluation (Fetus B)  Amniotic F.V:   Pocket => 2 cm two         F. Tone:        Observed                  planes  F. Movement:    Observed                   Score:          8/8  F. Breathing:   Observed ---------------------------------------------------------------------- Gestational Age (Fetus B)  LMP:           40w 0d        Date:  03/24/16                 EDD:   12/29/16  Best:          36w 4d      Det. ByMarcella Dubs         EDD:   01/22/17                                      (06/05/16) ---------------------------------------------------------------------- Impression  Dichorionic/diamniotic twin pregnancy at 36+4 weeks  Cephalic/breech presentation  Normal amniotic fluid volume x 2  BPP 8/8 x 2 ---------------------------------------------------------------------- Recommendations  BPPs next week ----------------------------------------------------------------------  Particia Nearing, MD Electronically Signed Final Report   12/29/2016 01:50 pm ----------------------------------------------------------------------  [place] Mfm Ob Follow Up  Result Date: 12/20/2016 ----------------------------------------------------------------------  OBSTETRICS REPORT                      (Signed Final 12/20/2016 01:02 pm) ---------------------------------------------------------------------- Patient Info  ID #:       [id number]                          D.O.B.:  11/08/1990 (26 yrs)  Name:       AUDRI KOZUB Malveaux                 Visit Date: 12/20/2016 10:18 am ---------------------------------------------------------------------- Performed By  Performed By:     Percell Boston          Ref. Address:     [address]                                                             [address]                                                             [place]                                                             [address]  Attending:        Particia Nearing MD       Location:         [facility]  Referred By:      Center for                    [facility]                    Healthcare -                    [place]([facility]                    ) ---------------------------------------------------------------------- Orders   #  Description                                 Code   1  [place] MFM OB FOLLOW UP  54098.11   2  [place] MFM OB FOLLOW UP ADDL GEST               91478.29  ----------------------------------------------------------------------   #  Ordered By               Order #        Accession #    Episode #   1  Particia Nearing            [id number]      [id number]     [id number]   2  MARTHA DECKER            [id number]      [id number]     [id number]  ---------------------------------------------------------------------- Indications   [redacted] weeks gestation of pregnancy                Z3A.35   Twin pregnancy, di/di, third trimester         O30.043   Obesity complicating pregnancy, third          O99.213   trimester (pre-pregnancy BMI 30.6)  ---------------------------------------------------------------------- OB History  Blood Type:            Height:  5'4"   Weight (lb):  192       BMI:  32.95  Gravidity:    3         Term:   1        Prem:   0        SAB:   0  TOP:          1       Ectopic:  0        Living: 1 ---------------------------------------------------------------------- Fetal Evaluation (Fetus A)  Num Of Fetuses:     2  Fetal Heart         138  Rate(bpm):  Cardiac Activity:   Observed  Fetal Lie:          Maternal right side  Presentation:       Cephalic  Placenta:           Anterior, above cervical os  P. Cord Insertion:  Visualized, central  Membrane Desc:      Dividing Membrane seen  Amniotic Fluid  AFI FV:      Subjectively within normal limits                              Largest Pocket(cm)                              6.9 ---------------------------------------------------------------------- Biometry (Fetus A)  BPD:      86.9  mm     G. Age:  35w 0d         48  %    CI:        81.81   %    70 - 86                                                          FL/HC:      20.6   %    20.1 - 22.3  HC:      303.2  mm  G. Age:  33w 5d        < 3  %    HC/AC:      1.00        0.93 - 1.11  AC:      304.1  mm     G. Age:  34w 3d         31  %    FL/BPD:     72.0   %    71 - 87  FL:       62.6  mm     G. Age:  32w  3d        < 3  %    FL/AC:      20.6   %    20 - 24  HUM:      54.7  mm     G. Age:  31w 6d        < 5  %  Est. FW:    2284  gm      5 lb 1 oz     30  %     FW Discordancy         3  % ---------------------------------------------------------------------- Gestational Age (Fetus A)  LMP:           38w 5d        Date:  03/24/16                 EDD:   12/29/16  U/S Today:     33w 6d                                        EDD:   02/01/17  Best:          35w 2d     Det. ByMarcella Dubs         EDD:   01/22/17                                      (06/05/16) ---------------------------------------------------------------------- Anatomy (Fetus A)  Cranium:               Appears normal         Aortic Arch:            Previously seen  Cavum:                 Previously seen        Ductal Arch:            Previously seen  Ventricles:            Previously seen        Diaphragm:              Previously seen  Choroid Plexus:        Previously seen        Stomach:                Appears normal, left  sided  Cerebellum:            Previously seen        Abdomen:                Previously seen  Posterior Fossa:       Previously seen        Abdominal Wall:         Previously seen  Nuchal Fold:           Previously seen        Cord Vessels:           Previously seen  Face:                  Orbits and profile     Kidneys:                Previously seen                         previously seen  Lips:                  Previously seen        Bladder:                Appears normal  Thoracic:              Appears normal         Spine:                  Previously seen  Heart:                 Appears normal         Upper Extremities:      Previously seen  RVOT:                  Previously seen        Lower Extremities:      Previously seen  LVOT:                  Previously seen  Other:  Female gender previously seen. Heels visualized. Nasal bone, and 5th          digit  previously visualized. ---------------------------------------------------------------------- Fetal Evaluation (Fetus B)  Num Of Fetuses:     2  Fetal Heart         119  Rate(bpm):  Cardiac Activity:   Observed  Fetal Lie:          Maternal left side  Presentation:       Cephalic  Placenta:           Anterior, above cervical os  P. Cord Insertion:  Visualized, central  Membrane Desc:      Dividing Membrane seen  Amniotic Fluid  AFI FV:      Subjectively within normal limits                              Largest Pocket(cm)                              3.6 ---------------------------------------------------------------------- Biometry (Fetus B)  BPD:      82.9  mm     G. Age:  33w 3d          8  %    CI:  68.02   %    70 - 86                                                          FL/HC:      20.8   %    20.1 - 22.3  HC:      321.5  mm     G. Age:  36w 2d         42  %    HC/AC:      1.08        0.93 - 1.11  AC:      297.3  mm     G. Age:  33w 5d         17  %    FL/BPD:     80.8   %    71 - 87  FL:         67  mm     G. Age:  34w 4d         23  %    FL/AC:      22.5   %    20 - 24  HUM:      55.4  mm     G. Age:  32w 2d          6  %  Est. FW:    2361  gm      5 lb 3 oz     38  %     FW Discordancy      0 \ 3 % ---------------------------------------------------------------------- Gestational Age (Fetus B)  LMP:           38w 5d        Date:  03/24/16                 EDD:   12/29/16  U/S Today:     34w 4d                                        EDD:   01/27/17  Best:          35w 2d     Det. ByMarcella Dubs         EDD:   01/22/17                                      (06/05/16) ---------------------------------------------------------------------- Anatomy (Fetus B)  Cranium:               Appears normal         Aortic Arch:            Previously seen  Cavum:                 Previously seen        Ductal Arch:            Previously seen  Ventricles:            Appears normal         Diaphragm:  Previously seen  Choroid Plexus:        Previously seen        Stomach:                Appears normal, left                                                                        sided  Cerebellum:            Previously seen        Abdomen:                Previously seen  Posterior Fossa:       Previously seen        Abdominal Wall:         Previously seen  Nuchal Fold:           Previously seen        Cord Vessels:           Previously seen  Face:                  Orbits and profile     Kidneys:                Appear normal                         previously seen  Lips:                  Previously seen        Bladder:                Appears normal  Thoracic:              Appears normal         Spine:                  Previously seen  Heart:                 Appears normal         Upper Extremities:      Previously seen                         (4CH, axis, and situs  RVOT:                  Previously seen        Lower Extremities:      Previously seen  LVOT:                  Previously seen  Other:  Female gender previously seen. Heels and Nasal bone previously          visualized. ---------------------------------------------------------------------- Cervix Uterus Adnexa  Cervix  Not visualized (advanced GA >29wks)  Uterus  No abnormality visualized.  Left Ovary  No adnexal mass visualized.  Right Ovary  No adnexal mass visualized.  Cul De Sac:   No free fluid seen.  Adnexa:       No abnormality visualized. ---------------------------------------------------------------------- Impression  Dichorionic/diamniotic twin pregnancy at 35+2 weeks  Cephalic/cephalic presentation  Normal interval anatomy x 2; anatomic survey complete  x 2  Normal amniotic fluid volume x 2  Appropriate interval growths with EFWs at the 30th and 38th  %tiles ---------------------------------------------------------------------- Recommendations  Would plan for a 38 week delivery ----------------------------------------------------------------------                  Particia Nearing, MD Electronically Signed Final Report   12/20/2016 01:02 pm ----------------------------------------------------------------------  [place] Mfm Ob Follow Up Addl Gest  Result Date: 12/20/2016 ----------------------------------------------------------------------  OBSTETRICS REPORT                      (Signed Final 12/20/2016 01:02 pm) ---------------------------------------------------------------------- Patient Info  ID #:       [id number]                          D.O.B.:  [birth year] (26 yrs)  Name:       TAESHA GOODELL Mosquera                 Visit Date: 12/20/2016 10:18 am ---------------------------------------------------------------------- Performed By  Performed By:     Percell Boston          Ref. Address:     [address]                                                             [address]                                                             [place]                                                             [address]  Attending:        Particia Nearing MD       [facility]         [facility] [facility]  Referred By:      Center for                    [facility]                    Healthcare -                    [place]([facility]                    ) ---------------------------------------------------------------------- Orders   #  Description                                 Code   1  [place] MFM OB FOLLOW UP                         [id number]   2  [place] MFM OB FOLLOW UP ADDL GEST               40981.19  ----------------------------------------------------------------------   #  Ordered By               Order #        Accession #    Episode #   1  Particia Nearing            [id number]      [id number]     [id number]   2  MARTHA DECKER            [id number]      [id number]     [id number]  ---------------------------------------------------------------------- Indications   [redacted] weeks gestation of pregnancy                 Z3A.35   Twin pregnancy, di/di, third trimester         O30.043   Obesity complicating pregnancy, third          O99.213   trimester (pre-pregnancy BMI 30.6)  ---------------------------------------------------------------------- OB History  Blood Type:            Height:  5'4"   Weight (lb):  192       BMI:  32.95  Gravidity:    3         Term:   1        Prem:   0        SAB:   0  TOP:          1       Ectopic:  0        Living: 1 ---------------------------------------------------------------------- Fetal Evaluation (Fetus A)  Num Of Fetuses:     2  Fetal Heart         138  Rate(bpm):  Cardiac Activity:   Observed  Fetal Lie:          Maternal right side  Presentation:       Cephalic  Placenta:           Anterior, above cervical os  P. Cord Insertion:  Visualized, central  Membrane Desc:      Dividing Membrane seen  Amniotic Fluid  AFI FV:      Subjectively within normal limits                              Largest Pocket(cm)                              6.9 ---------------------------------------------------------------------- Biometry (Fetus A)  BPD:      86.9  mm     G. Age:  35w 0d         48  %    CI:        81.81   %    70 - 86  FL/HC:      20.6   %    20.1 - 22.3  HC:      303.2  mm     G. Age:  33w 5d        < 3  %    HC/AC:      1.00        0.93 - 1.11  AC:      304.1  mm     G. Age:  34w 3d         31  %    FL/BPD:     72.0   %    71 - 87  FL:       62.6  mm     G. Age:  32w 3d        < 3  %    FL/AC:      20.6   %    20 - 24  HUM:      54.7  mm     G. Age:  31w 6d        < 5  %  Est. FW:    2284  gm      5 lb 1 oz     30  %     FW Discordancy         3  % ---------------------------------------------------------------------- Gestational Age (Fetus A)  LMP:           38w 5d        Date:  03/24/16                 EDD:   12/29/16  U/S Today:     33w 6d                                        EDD:   02/01/17  Best:          35w 2d     Det. ByMarcella Dubs:  Early  Ultrasound         EDD:   01/22/17                                      (06/05/16) ---------------------------------------------------------------------- Anatomy (Fetus A)  Cranium:               Appears normal         Aortic Arch:            Previously seen  Cavum:                 Previously seen        Ductal Arch:            Previously seen  Ventricles:            Previously seen        Diaphragm:              Previously seen  Choroid Plexus:        Previously seen        Stomach:                Appears normal, left  sided  Cerebellum:            Previously seen        Abdomen:                Previously seen  Posterior Fossa:       Previously seen        Abdominal Wall:         Previously seen  Nuchal Fold:           Previously seen        Cord Vessels:           Previously seen  Face:                  Orbits and profile     Kidneys:                Previously seen                         previously seen  Lips:                  Previously seen        Bladder:                Appears normal  Thoracic:              Appears normal         Spine:                  Previously seen  Heart:                 Appears normal         Upper Extremities:      Previously seen  RVOT:                  Previously seen        Lower Extremities:      Previously seen  LVOT:                  Previously seen  Other:  Female gender previously seen. Heels visualized. Nasal bone, and 5th          digit previously visualized. ---------------------------------------------------------------------- Fetal Evaluation (Fetus B)  Num Of Fetuses:     2  Fetal Heart         119  Rate(bpm):  Cardiac Activity:   Observed  Fetal Lie:          Maternal left side  Presentation:       Cephalic  Placenta:           Anterior, above cervical os  P. Cord Insertion:  Visualized, central  Membrane Desc:      Dividing Membrane seen  Amniotic Fluid  AFI FV:      Subjectively within normal limits                               Largest Pocket(cm)                              3.6 ---------------------------------------------------------------------- Biometry (Fetus B)  BPD:      82.9  mm     G. Age:  33w 3d          8  %    CI:  68.02   %    70 - 86                                                          FL/HC:      20.8   %    20.1 - 22.3  HC:      321.5  mm     G. Age:  36w 2d         42  %    HC/AC:      1.08        0.93 - 1.11  AC:      297.3  mm     G. Age:  33w 5d         17  %    FL/BPD:     80.8   %    71 - 87  FL:         67  mm     G. Age:  34w 4d         23  %    FL/AC:      22.5   %    20 - 24  HUM:      55.4  mm     G. Age:  32w 2d          6  %  Est. FW:    2361  gm      5 lb 3 oz     38  %     FW Discordancy      0 \ 3 % ---------------------------------------------------------------------- Gestational Age (Fetus B)  LMP:           38w 5d        Date:  03/24/16                 EDD:   12/29/16  U/S Today:     34w 4d                                        EDD:   01/27/17  Best:          35w 2d     Det. ByMarcella Dubs         EDD:   01/22/17                                      (06/05/16) ---------------------------------------------------------------------- Anatomy (Fetus B)  Cranium:               Appears normal         Aortic Arch:            Previously seen  Cavum:                 Previously seen        Ductal Arch:            Previously seen  Ventricles:            Appears normal         Diaphragm:  Previously seen  Choroid Plexus:        Previously seen        Stomach:                Appears normal, left                                                                        sided  Cerebellum:            Previously seen        Abdomen:                Previously seen  Posterior Fossa:       Previously seen        Abdominal Wall:         Previously seen  Nuchal Fold:           Previously seen        Cord Vessels:           Previously seen  Face:                  Orbits and  profile     Kidneys:                Appear normal                         previously seen  Lips:                  Previously seen        Bladder:                Appears normal  Thoracic:              Appears normal         Spine:                  Previously seen  Heart:                 Appears normal         Upper Extremities:      Previously seen                         (4CH, axis, and situs  RVOT:                  Previously seen        Lower Extremities:      Previously seen  LVOT:                  Previously seen  Other:  Female gender previously seen. Heels and Nasal bone previously          visualized. ---------------------------------------------------------------------- Cervix Uterus Adnexa  Cervix  Not visualized (advanced GA >29wks)  Uterus  No abnormality visualized.  Left Ovary  No adnexal mass visualized.  Right Ovary  No adnexal mass visualized.  Cul De Sac:   No free fluid seen.  Adnexa:       No abnormality visualized. ---------------------------------------------------------------------- Impression  Dichorionic/diamniotic twin pregnancy at 35+2 weeks  Cephalic/cephalic presentation  Normal interval anatomy x 2; anatomic survey  complete x 2  Normal amniotic fluid volume x 2  Appropriate interval growths with EFWs at the 30th and 38th  %tiles ---------------------------------------------------------------------- Recommendations  Would plan for a 38 week delivery ----------------------------------------------------------------------                 Particia Nearing, MD Electronically Signed Final Report   12/20/2016 01:02 pm ----------------------------------------------------------------------  [place] Mfm Fetal Bpp Wo Nst Addl Gestation  Result Date: 01/05/2017 ----------------------------------------------------------------------  OBSTETRICS REPORT                      (Signed Final 01/05/2017 02:34 pm) ---------------------------------------------------------------------- Patient Info  ID #:        [id number]                          D.O.B.:  [birth year] (26 yrs)  Name:       NEA GITTENS Geesey                 Visit Date: 01/05/2017 01:30 pm ---------------------------------------------------------------------- Performed By  Performed By:     Lenise Arena        Ref. Address:     [address]                                                             [address]                                                             [place]                                                             [address]  Attending:        Charlsie Merles MD         Secondary Phy.:   MAU Nursing-                                                             MAU/Triage  Referred By:      Center for             Location:         [facility]                    Healthcare -                    [place]([facility]                    ) ---------------------------------------------------------------------- Orders   #  Description                                 Code   1  [place] MFM FETAL BPP WO NON STRESS              76819.01   2  [place] MFM FETAL BPP WO NST ADDL                [id number]      [facility]ST[facility]  ----------------------------------------------------------------------   #  Ordered By               Order #        Accession #    Episode #   1  PEGGY CONSTANT           [id number]      [id number]     [id number]   2  PEGGY CONSTANT           [id number]      [id number]     [id number]  ---------------------------------------------------------------------- Indications   [redacted] weeks gestation of pregnancy                Z3A.37   Twin pregnancy, di/di, third trimester         O30.043   Obesity complicating pregnancy, third          O99.213   trimester (pre-pregnancy BMI 30.6)  ---------------------------------------------------------------------- OB History  Blood Type:            Height:  5'4"   Weight (lb):  192       BMI:  32.95  Gravidity:    3          Term:   1        Prem:   0        SAB:   0  TOP:          1       Ectopic:  0        Living: 1 ---------------------------------------------------------------------- Fetal Evaluation (Fetus A)  Num Of Fetuses:     2  Fetal Heart         153  Rate(bpm):  Cardiac Activity:   Observed  Fetal Lie:          Right Fetus  Presentation:       Cephalic  Amniotic Fluid  AFI FV:      Subjectively within normal limits                              Largest Pocket(cm)                              7.25 ---------------------------------------------------------------------- Biophysical Evaluation (Fetus A)  Amniotic F.V:   Within normal limits       F. Tone:        Not Observed  F. Movement:    Not Observed  Score:          4/8  F. Breathing:   Observed ---------------------------------------------------------------------- Gestational Age (Fetus A)  LMP:           41w 0d        Date:  03/24/16                 EDD:   12/29/16  Best:          37w 4d     Det. ByMarcella Dubs         EDD:   01/22/17                                      (06/05/16) ---------------------------------------------------------------------- Anatomy (Fetus A)  Stomach:               Appears normal, left   Bladder:                Appears normal                         sided ---------------------------------------------------------------------- Fetal Evaluation (Fetus B)  Num Of Fetuses:     2  Fetal Heart         128  Rate(bpm):  Cardiac Activity:   Observed  Fetal Lie:          Left Fetus  Presentation:       Cephalic  Amniotic Fluid  AFI FV:      Subjectively within normal limits                              Largest Pocket(cm)                              4.81 ---------------------------------------------------------------------- Biophysical Evaluation (Fetus B)  Amniotic F.V:   Within normal limits       F. Tone:        Observed  F. Movement:    Observed                   Score:          8/8  F. Breathing:   Observed  ---------------------------------------------------------------------- Gestational Age (Fetus B)  LMP:           41w 0d        Date:  03/24/16                 EDD:   12/29/16  Best:          37w 4d     Det. By:  Marcella Dubs         EDD:   01/22/17                                      (06/05/16) ---------------------------------------------------------------------- Anatomy (Fetus B)  Stomach:               Appears normal, left   Bladder:                Appears normal                         sided ----------------------------------------------------------------------  Impression  Dichorionic/diamniotic twin pregnancy at 37+4 weeks  Cephalic/cephalic presentation  Normal amniotic fluid volume x 2  BPP 8/8 for twin B  BPP 4/8 for twin A, with no movement or tone ---------------------------------------------------------------------- Recommendations  Given BPP score for A and EGA>37 weeks, would  recommend admission and delivery (was scheduled for  induction on 11/26) ----------------------------------------------------------------------                 Charlsie Merles, MD Electronically Signed Final Report   01/05/2017 02:34 pm ----------------------------------------------------------------------  [place] Mfm Fetal Bpp Wo Nst Addl Gestation  Result Date: 12/29/2016 ----------------------------------------------------------------------  OBSTETRICS REPORT                      (Signed Final 12/29/2016 01:50 pm) ---------------------------------------------------------------------- Patient Info  ID #:       [id number]                          D.O.B.:  [birth year] (26 yrs)  Name:       LISIA WESTBAY Breidenbach                 Visit Date: 12/29/2016 11:10 am ---------------------------------------------------------------------- Performed By  Performed By:     Vivien Rota        Ref. Address:     [address]                                                              [address]                                                             [place]                                                             [address]  Attending:        Particia Nearing MD       Location:         [facility]  Referred By:      Center for                    [facility]                    Healthcare -  [place]([facility]                    ) ---------------------------------------------------------------------- Orders   #  Description                                 Code   1  [place] MFM FETAL BPP WO NON STRESS              76819.01   2  [place] MFM FETAL BPP WO NST ADDL                16109.6      GEST[facility]  ----------------------------------------------------------------------   #  Ordered By               Order #        Accession #    Episode #   1  PEGGY CONSTANT           [id number]      [id number]     [id number]   2  PEGGY CONSTANT           [id number]      [id number]     [id number]  ---------------------------------------------------------------------- Indications   [redacted] weeks gestation of pregnancy                Z3A.36   Twin pregnancy, di/di, third trimester         O30.043   Obesity complicating pregnancy, third          O99.213   trimester (pre-pregnancy BMI 30.6)  ---------------------------------------------------------------------- OB History  Blood Type:            Height:  5'4"   Weight (lb):  192       BMI:  32.95  Gravidity:    3         Term:   1        Prem:   0        SAB:   0  TOP:          1       Ectopic:  0        Living: 1 ---------------------------------------------------------------------- Fetal Evaluation (Fetus A)  Num Of Fetuses:     2  Fetal Heart         130  Rate(bpm):  Cardiac Activity:   Observed  Fetal Lie:          Right Fetus  Presentation:       Cephalic  Amniotic Fluid  AFI FV:      Subjectively within normal limits                              Largest Pocket(cm)                              5.07  ---------------------------------------------------------------------- Biophysical Evaluation (Fetus A)  Amniotic F.V:   Pocket => 2 cm two         F. Tone:        Observed                  planes  F. Movement:    Observed                   Score:  8/8  F. Breathing:   Observed ---------------------------------------------------------------------- Gestational Age (Fetus A)  LMP:           40w 0d        Date:  03/24/16                 EDD:   12/29/16  Best:          36w 4d     Det. ByMarcella Dubs         EDD:   01/22/17                                      (06/05/16) ---------------------------------------------------------------------- Fetal Evaluation (Fetus B)  Num Of Fetuses:     2  Fetal Heart         129  Rate(bpm):  Cardiac Activity:   Observed  Fetal Lie:          Left Fetus  Presentation:       Breech  Amniotic Fluid  AFI FV:      Subjectively within normal limits                              Largest Pocket(cm)                              7.70 ---------------------------------------------------------------------- Biophysical Evaluation (Fetus B)  Amniotic F.V:   Pocket => 2 cm two         F. Tone:        Observed                  planes  F. Movement:    Observed                   Score:          8/8  F. Breathing:   Observed ---------------------------------------------------------------------- Gestational Age (Fetus B)  LMP:           40w 0d        Date:  03/24/16                 EDD:   12/29/16  Best:          36w 4d     Det. ByMarcella Dubs         EDD:   01/22/17                                      (06/05/16) ---------------------------------------------------------------------- Impression  Dichorionic/diamniotic twin pregnancy at 36+4 weeks  Cephalic/breech presentation  Normal amniotic fluid volume x 2  BPP 8/8 x 2 ---------------------------------------------------------------------- Recommendations  BPPs next week  ----------------------------------------------------------------------                 Particia Nearing, MD Electronically Signed Final Report   12/29/2016 01:50 pm ----------------------------------------------------------------------   MAU Course/MDM: Dr Jolayne Panther was not available at time of low BPP score of 4/8 because of OR case so pt sent to MFM for additional monitoring Accels noted for both Baby A and Baby B in MAU Consult Dr Jolayne Panther Admit to [address] [facility] for IOL Cervix 4/70/-3, vertex position noted for Baby A  EFM difficult to trace both babies separately  so bedside US by Sharen Counter, CNM, with both FHR visualized at 126 and 128 respectively.  Second RN to room to adjust EFM for improved tracing.  Assessment: 1. Dichorionic diamniotic twin pregnancy in third trimester   2. Abnormal fetal ultrasound   3.  GBS positive  Plan: Admit to BS IOL for BPP 4/8 at term Expectant management on admission because may be early labor with advanced dilation and contraction pattern Continuous EFM  PCN for GBS prophylaxis Anticipate NSVD x2   Sharen Counter Certified Nurse-Midwife 01/05/2017 3:16 PM

## 2017-01-05 NOTE — H&P (Signed)
LABOR AND DELIVERY ADMISSION HISTORY AND PHYSICAL NOTE  *DO NOT discuss prior abortions in front of FOB*  Kathleen Esparza is a 26 y.o. female [id number] with IUP at 3670w4d by US presenting for IOL complicated by GBS(+) and dichoirionic/diamniotic twins. Baby A is 4/8bpp She reports positive fetal movement. She denies leakage of fluid or vaginal bleeding.  Prenatal History/Complications: [facility] Pregnancy complications:  - GBS pos, twins (vertex/vertex)   Clinic [facility] Prenatal Labs  Dating U/S Blood type: O/Positive/-- (05/23 1638)   Genetic Screen 1 Screen:    AFP:     Quad:     NIPS: Antibody:Negative (05/23 1638)  Anatomic US 08-30-16 Rubella: 1.38 (05/23 1638)  GTT Early:               Third trimester:  RPR: Non Reactive (09/11 1108)   Flu vaccine declined HBsAg: Negative (05/23 1638)   TDaP vaccine 11/07/16                                              Rhogam: HIV:   NR   Baby Food  Bottle                                    GBS: positive (For PCN allergy, check sensitivities)  Contraception undecided Pap: 07/05/16 neg  Circumcision Yes    Pediatrician  Dr. Donnie Coffinubin   Support Person FOB   Prenatal Classes no     Past Medical History: Past Medical History:  Diagnosis Date  . Medical history non-contributory     Past Surgical History: Past Surgical History:  Procedure Laterality Date  . INDUCED ABORTION    . TONSILLECTOMY  2016    Obstetrical History: OB History    Gravida Para Term Preterm AB Living   4 1 1   2 1    SAB TAB Ectopic Multiple Live Births     2     1      Social History: Social History   Socioeconomic History  . Marital status: Single    Spouse name: None  . Number of children: None  . Years of education: None  . Highest education level: None  Social Needs  . Financial resource strain: None  . Food insecurity - worry: None  . Food insecurity - inability: None  . Transportation needs - medical: None  . Transportation needs - non-medical: None   Occupational History  . None  Tobacco Use  . Smoking status: Former Smoker    Last attempt to quit: 2017    Years since quitting: 1.8  . Smokeless tobacco: Never Used  Substance and Sexual Activity  . Alcohol use: No    Alcohol/week: 0.0 oz  . Drug use: No  . Sexual activity: Yes    Partners: Male    Birth control/protection: None  Other Topics Concern  . None  Social History Narrative  . None    Family History: Family History  Problem Relation Age of Onset  . COPD Maternal Grandmother   . Diabetes Maternal Grandmother   . Hypertension Maternal Grandmother   . Other Neg Hx     Allergies: Not on File  Medications Prior to Admission  Medication Sig Dispense Refill Last Dose  . acetaminophen (TYLENOL) 325 MG tablet Take 325  mg by mouth every 6 (six) hours as needed for moderate pain.   01/04/2017 at Unknown time  . cholecalciferol (VITAMIN D) 1000 units tablet Take 1,000 Units by mouth daily.    01/04/2017 at Unknown time  . ferrous sulfate 325 (65 FE) MG tablet Take 1 tablet (325 mg total) by mouth 2 (two) times daily with a meal. 60 tablet 5 Past Week at Unknown time  . folic acid (FOLVITE) 1 MG tablet Take 1 mg by mouth daily.   01/04/2017 at Unknown time  . Prenatal Vit-Fe Fumarate-FA (MULTIVITAMIN-PRENATAL) 27-0.8 MG TABS tablet Take 1 tablet by mouth daily at 12 noon.   01/04/2017 at Unknown time  . zolpidem (AMBIEN CR) 12.5 MG CR tablet Take 1 tablet (12.5 mg total) at bedtime as needed by mouth for sleep. 2 tablet 0 Taking     Review of Systems  All systems reviewed and negative except as stated in HPI  Physical Exam Blood pressure 124/89, pulse 75, height 5\' 4"  (1.626 m), weight 223 lb (101.2 kg), last menstrual period 03/24/2016, SpO2 98 %. General appearance: alert, cooperative, appears stated age and no distress Lungs: clear to auscultation bilaterally Heart: regular rate and rhythm Abdomen: soft, non-tender; bowel sounds normal Extremities: No calf  swelling or tenderness Presentation: vertex/vertex Fetal monitoring: Cat 1 for both babies, baby A 4/9bpp, baby B 8/8 bpp Uterine activity: intermittent contraction Dilation: 4 Effacement (%): 70 Station: -3 Exam by:: Misty StanleyLisa, CNM   Prenatal labs: ABO, Rh: --/--/O POS (11/23 1623) Antibody: PENDING (11/23 1623) Rubella: 1.38 (05/23 1638) RPR: Non Reactive (09/11 1108)  HBsAg: Negative (05/23 1638)  HIV:   non-reactive GC/Chlamydia: neg  GBS: Positive (11/14 0000)  1 hr Glucola: 141 Genetic screening:  neg Anatomy US: normal  Prenatal Transfer Tool  Maternal Diabetes: No Genetic Screening: Normal Maternal Ultrasounds/Referrals: Normal Fetal Ultrasounds or other Referrals:  Other: Baby A bpp 4 Maternal Substance Abuse:  No Significant Maternal Medications:  None Significant Maternal Lab Results: None  Results for orders placed or performed during the hospital encounter of 01/05/17 (from the past 24 hour(s))  CBC   Collection Time: 01/05/17  4:23 PM  Result Value Ref Range   WBC 11.9 (H) 4.0 - 10.5 K/uL   RBC 3.73 (L) 3.87 - 5.11 MIL/uL   Hemoglobin 11.9 (L) 12.0 - 15.0 g/dL   HCT 96.034.6 (L) 45.436.0 - 09.846.0 %   MCV 92.8 78.0 - 100.0 fL   MCH 31.9 26.0 - 34.0 pg   MCHC 34.4 30.0 - 36.0 g/dL   RDW 11.913.7 14.711.5 - 82.915.5 %   Platelets 251 150 - 400 K/uL  Type and screen [facility]    Collection Time: 01/05/17  4:23 PM  Result Value Ref Range   ABO/RH(D) O POS    Antibody Screen PENDING    Sample Expiration 01/08/2017     Patient Active Problem List   Diagnosis Date Noted  . Encounter for induction of labor 01/05/2017  . GBS (group B Streptococcus carrier), +RV culture, currently pregnant 01/01/2017  . Dichorionic diamniotic twin gestation 11/07/2016  . Acute right-sided low back pain with right-sided sciatica 08/30/2016  . Supervision of other normal pregnancy, antepartum 07/05/2016    Assessment: Annetta MawLaquisha S Will is a 26 y.o. [id number] at 4946w4d here for IOL  for twins with concerning BPP for baby A (4)  #Labor: expectant management #Pain: Tylenol/oxy #FWB: Cat1 #ID:  GBS pos #MOF: bottle #MOC:undecided #Circ:  yes  Marthenia RollingScott Evelin Cake 01/05/2017, 5:02 PM

## 2017-01-06 ENCOUNTER — Inpatient Hospital Stay (HOSPITAL_COMMUNITY): Payer: Medicaid Other | Admitting: Anesthesiology

## 2017-01-06 ENCOUNTER — Encounter (HOSPITAL_COMMUNITY): Payer: Self-pay

## 2017-01-06 DIAGNOSIS — O99824 Streptococcus B carrier state complicating childbirth: Secondary | ICD-10-CM

## 2017-01-06 DIAGNOSIS — Z3A37 37 weeks gestation of pregnancy: Secondary | ICD-10-CM

## 2017-01-06 DIAGNOSIS — O30043 Twin pregnancy, dichorionic/diamniotic, third trimester: Secondary | ICD-10-CM

## 2017-01-06 LAB — RPR: RPR: NONREACTIVE

## 2017-01-06 MED ORDER — TETANUS-DIPHTH-ACELL PERTUSSIS 5-2.5-18.5 LF-MCG/0.5 IM SUSP: 0.5000 mL | Freq: Once | INTRAMUSCULAR | Status: DC

## 2017-01-06 MED ORDER — WITCH HAZEL-GLYCERIN EX PADS
1.0000 "application " | MEDICATED_PAD | CUTANEOUS | Status: DC | PRN
  Administered 2017-01-06 – 2017-01-07 (×2): 1 via TOPICAL

## 2017-01-06 MED ORDER — SIMETHICONE 80 MG PO CHEW
80.0000 mg | CHEWABLE_TABLET | ORAL | Status: DC | PRN
  Administered 2017-01-06 – 2017-01-07 (×3): 80 mg via ORAL
  Filled 2017-01-06 (×2): qty 1

## 2017-01-06 MED ORDER — ACETAMINOPHEN 325 MG PO TABS
650.0000 mg | ORAL_TABLET | ORAL | Status: DC | PRN
  Administered 2017-01-06 – 2017-01-08 (×3): 650 mg via ORAL
  Filled 2017-01-06: qty 2

## 2017-01-06 MED ORDER — DIPHENHYDRAMINE HCL 25 MG PO CAPS
25.0000 mg | ORAL_CAPSULE | Freq: Four times a day (QID) | ORAL | Status: DC | PRN
  Administered 2017-01-06: 25 mg via ORAL
  Filled 2017-01-06: qty 1

## 2017-01-06 MED ORDER — DIBUCAINE 1 % RE OINT
1.0000 "application " | TOPICAL_OINTMENT | RECTAL | Status: DC | PRN
  Administered 2017-01-06: 1 via RECTAL
  Filled 2017-01-06: qty 28

## 2017-01-06 MED ORDER — BENZOCAINE-MENTHOL 20-0.5 % EX AERO
1.0000 "application " | INHALATION_SPRAY | CUTANEOUS | Status: DC | PRN
  Administered 2017-01-06: 1 via TOPICAL
  Filled 2017-01-06: qty 56

## 2017-01-06 MED ORDER — ONDANSETRON HCL 4 MG/2ML IJ SOLN: 4.0000 mg | INTRAMUSCULAR | Status: DC | PRN

## 2017-01-06 MED ORDER — PRENATAL MULTIVITAMIN CH
1.0000 | ORAL_TABLET | Freq: Every day | ORAL | Status: DC
  Administered 2017-01-06 – 2017-01-08 (×3): 1 via ORAL
  Filled 2017-01-06 (×3): qty 1

## 2017-01-06 MED ORDER — IBUPROFEN 600 MG PO TABS
600.0000 mg | ORAL_TABLET | Freq: Four times a day (QID) | ORAL | Status: DC
  Administered 2017-01-06 – 2017-01-08 (×9): 600 mg via ORAL
  Filled 2017-01-06 (×10): qty 1

## 2017-01-06 MED ORDER — ONDANSETRON HCL 4 MG PO TABS: 4.0000 mg | ORAL_TABLET | ORAL | Status: DC | PRN

## 2017-01-06 MED ORDER — COCONUT OIL OIL: 1.0000 "application " | TOPICAL_OIL | Status: DC | PRN

## 2017-01-06 MED ORDER — ZOLPIDEM TARTRATE 5 MG PO TABS: 5.0000 mg | ORAL_TABLET | Freq: Every evening | ORAL | Status: DC | PRN

## 2017-01-06 MED ORDER — LIDOCAINE HCL (PF) 1 % IJ SOLN
INTRAMUSCULAR | Status: DC | PRN
  Administered 2017-01-06: 6 mL via EPIDURAL
  Administered 2017-01-06: 4 mL

## 2017-01-06 MED ORDER — SENNOSIDES-DOCUSATE SODIUM 8.6-50 MG PO TABS
2.0000 | ORAL_TABLET | ORAL | Status: DC
  Administered 2017-01-07 – 2017-01-08 (×2): 2 via ORAL
  Filled 2017-01-06 (×2): qty 2

## 2017-01-06 NOTE — Progress Notes (Signed)
2248: Pt c/o of lower abdominal cramping and "feeling gassy" Percocet 1 tab and Simethicone 80 mg PO administered. We will continue to monitor.

## 2017-01-06 NOTE — Anesthesia Procedure Notes (Signed)
Epidural Patient location during procedure: OB  Staffing Anesthesiologist: Cuahutemoc Attar, MD  Preanesthetic Checklist Completed: patient identified, pre-op evaluation, timeout performed, IV checked, risks and benefits discussed and monitors and equipment checked  Epidural Patient position: sitting Prep: DuraPrep Patient monitoring: blood pressure and continuous pulse ox Approach: right paramedian Location: L3-L4 Injection technique: LOR air  Needle:  Needle type: Tuohy  Needle gauge: 17 G Needle insertion depth: 6 cm Catheter type: closed end flexible Catheter size: 19 Gauge Catheter at skin depth: 12 cm Test dose: negative  Assessment Sensory level: T8  Additional Notes   Dosing of Epidural:  1st dose, through catheter .............................................  Xylocaine 40 mg  2nd dose, through catheter, after waiting 3 minutes.........Xylocaine 60 mg    As each dose occurred, patient was free of IV sx; and patient exhibited no evidence of SA injection.  Patient is more comfortable after epidural dosed. Please see RN's note for documentation of vital signs,and FHR which are stable.  Patient reminded not to try to ambulate with numb legs, and that an RN must be present when she attempts to get up.          

## 2017-01-06 NOTE — Anesthesia Preprocedure Evaluation (Signed)
Anesthesia Evaluation  Patient identified by MRN, date of birth, ID band Patient awake    Reviewed: Allergy & Precautions, H&P , Patient's Chart, lab work & pertinent test results  Airway Mallampati: II  TM Distance: >3 FB Neck ROM: full    Dental  (+) Teeth Intact   Pulmonary former smoker,    breath sounds clear to auscultation       Cardiovascular  Rhythm:regular Rate:Normal     Neuro/Psych    GI/Hepatic   Endo/Other    Renal/GU      Musculoskeletal   Abdominal   Peds  Hematology   Anesthesia Other Findings       Reproductive/Obstetrics (+) Pregnancy                             Anesthesia Physical Anesthesia Plan  ASA: III  Anesthesia Plan: Epidural   Post-op Pain Management:    Induction:   PONV Risk Score and Plan:   Airway Management Planned:   Additional Equipment:   Intra-op Plan:   Post-operative Plan:   Informed Consent: I have reviewed the patients History and Physical, chart, labs and discussed the procedure including the risks, benefits and alternatives for the proposed anesthesia with the patient or authorized representative who has indicated his/her understanding and acceptance.   Dental Advisory Given  Plan Discussed with:   Anesthesia Plan Comments: (Labs checked- platelets confirmed with RN in room. Fetal heart tracing, per RN, reported to be stable enough for sitting procedure. Discussed epidural, and patient consents to the procedure:  included risk of possible headache,backache, failed block, allergic reaction, and nerve injury. This patient was asked if she had any questions or concerns before the procedure started.)        Anesthesia Quick Evaluation

## 2017-01-06 NOTE — Progress Notes (Signed)
Labor Progress Note Kathleen Esparza is a 26 y.o. [id number] at 7165w5d presented for SOL. S: Patient comfortable with epidural  O:  BP 125/78   Pulse 71   Temp 98 F (36.7 C) (Oral)   Resp 16   Ht 5\' 4"  (1.626 m)   Wt 223 lb (101.2 kg)   LMP 03/24/2016 (Approximate) Comment: implanon removed in Feb, never had a cycle  SpO2 99%   BMI 38.28 kg/m   Twin A: 140 bpm/mod var/no decels Twin B: 130 bpm/mod var/no decels   CVE: Dilation: 6 Effacement (%): 70 Cervical Position: Posterior Station: -2 Presentation: Vertex Exam by:: Gearldine Bienenstockiana Castillo   [name] 6165w5d here for SOL. #Labor: Progressing well. AROM with clear fluid. FSE placed on twin A. #Pain: epidural #FWB: Twin A cat 1, Twin B cat 1  [name], DO 1:13 AM

## 2017-01-06 NOTE — Anesthesia Postprocedure Evaluation (Signed)
Anesthesia Post Note  Patient: Annetta MawLaquisha S Dini  Procedure(s) Performed: AN AD HOC LABOR EPIDURAL     Patient location during evaluation: Mother Baby Anesthesia Type: Epidural Level of consciousness: awake and alert Pain management: pain level controlled Vital Signs Assessment: post-procedure vital signs reviewed and stable Respiratory status: spontaneous breathing Cardiovascular status: blood pressure returned to baseline and stable Postop Assessment: no headache, patient able to bend at knees, no backache, no apparent nausea or vomiting, epidural receding and adequate PO intake Anesthetic complications: no    Last Vitals:  Vitals:   01/06/17 0600 01/06/17 0656  BP: 121/70 123/75  Pulse: 73 74  Resp: 12 12  Temp: 36.7 C 36.9 C  SpO2:      Last Pain:  Vitals:   01/06/17 0656  TempSrc: Oral  PainSc:    Pain Goal:                 Salome ArntSterling, Lollie Gunner Marie

## 2017-01-07 ENCOUNTER — Other Ambulatory Visit: Payer: Self-pay

## 2017-01-07 MED ORDER — INFLUENZA VAC SPLIT QUAD 0.5 ML IM SUSY
0.5000 mL | PREFILLED_SYRINGE | INTRAMUSCULAR | Status: AC
  Administered 2017-01-08: 0.5 mL via INTRAMUSCULAR
  Filled 2017-01-07: qty 0.5

## 2017-01-07 NOTE — Discharge Summary (Signed)
OB Discharge Summary     Patient Name: Kathleen Esparza DOB: [birth year] MRN: [id number]  Date of admission: 01/05/2017 Delivering MD:    Orlie Dakinutt, BoyA Jeannifer [[id number]][[id number]]  MOSS, AMBER   Linford Arnoldutt, BoyB [place] [[id number]][[id number]]  MOSS, AMBER   Date of discharge: 01/07/2017  Admitting diagnosis: 37.4WKS TWINS BABY A BPP 4 OUT OF 8 Intrauterine pregnancy: 7515w6d     Secondary diagnosis:  Active Problems:   Encounter for induction of labor   SVD (spontaneous vaginal delivery)  Additional problems: none     Discharge diagnosis: Term SVD, twins                                                                                        Post partum procedures:n/a  Augmentation: Pitocin  Complications: None  Hospital course:  Induction of Labor With Vaginal Delivery   26 y.o. yo [id number] at 8915w6d was admitted to the hospital 01/05/2017 for induction of labor.  Indication for induction: BPP 4/8 for baby A.  Patient had an uncomplicated labor course as follows: Membrane Rupture Time/Date:    Orlie Dakinutt, BoyA Anaisha [[id number]][[id number]]  1:00 AM   Linford Arnoldutt, BoyB Verenis [[id number]][[id number]]  3:34 AM ,   Orlie Dakinutt, BoyA Shantaya [[id number]][[id number]]  01/06/2017   Linford Arnoldutt, BoyB Chelby [[id number]][[id number]]  01/06/2017   Intrapartum Procedures: Episiotomy:    Orlie Dakinutt, BoyA Shamiya [[id number]][[id number]]  None [1]   Gerilyn Pilgrimutt, BoyB Olevia [[id number]][[id number]]  None [1]                                         Lacerations:     Orlie Dakinutt, BoyA Epiphany [[id number]][[id number]]  Labial [10]   Linford Arnoldutt, BoyB Alyana [[id number]][[id number]]  None [1]  Patient had delivery of a Viable infant.  Information for the patient's newborn:  Orlie Dakinutt, BoyA Charitie [[id number]][[id number]]  Delivery Method: Vaginal, Spontaneous(Filed from Delivery Summary) Information for the patient's newborn:  Linford Arnoldutt, BoyB Johniece [[id number]][[id number]]  Delivery Method: Vaginal, Spontaneous(Filed from Delivery Summary)     Orlie Dakinutt, BoyA Romi [[id number]][[id number]]  01/06/2017   Linford Arnoldutt, BoyB Monti [[id number]][[id number]]  01/06/2017  Details of delivery  can be found in separate delivery note.  Patient had a routine postpartum course. Patient is discharged home 01/07/17.  Physical exam  Vitals:   01/06/17 1142 01/06/17 1743 01/06/17 1745 01/07/17 0600  BP: (!) 137/91 128/80 111/64 (!) 103/56  Pulse: 70 (!) 59 72 63  Resp: 16 16 16 18   Temp: 98.1 F (36.7 C) 97.9 F (36.6 C) 98.4 F (36.9 C)   TempSrc: Oral Axillary Oral   SpO2:  100%  98%  Weight:      Height:       General: alert, cooperative and no distress Lochia: appropriate Uterine Fundus: firm Incision: N/A DVT Evaluation: No evidence of DVT seen on physical exam. Labs: Lab Results  Component Value Date   WBC 11.9 (H) 01/05/2017   HGB 11.9 (L) 01/05/2017   HCT 34.6 (L) 01/05/2017   MCV 92.8 01/05/2017   PLT 251 01/05/2017   CMP Latest Ref Rng & Units 01/09/2015  Glucose 65 -  99 mg/dL 161(W101(H)  BUN 6 - 20 mg/dL 10  Creatinine 9.600.44 - 4.541.00 mg/dL 0.980.89  Sodium 119135 - 147145 mmol/L 138  Potassium 3.5 - 5.1 mmol/L 3.8  Chloride 101 - 111 mmol/L 106  CO2 22 - 32 mmol/L 27  Calcium 8.9 - 10.3 mg/dL 8.9  Total Protein 6.5 - 8.1 g/dL 6.4(L)  Total Bilirubin 0.3 - 1.2 mg/dL 0.4  Alkaline Phos 38 - 126 U/L 65  AST 15 - 41 U/L 17  ALT 14 - 54 U/L 13(L)    Discharge instruction: per After Visit Summary and "Baby and Me Booklet".  After visit meds:  Allergies as of 01/07/2017   No Known Allergies     Medication List    STOP taking these medications   zolpidem 12.5 MG CR tablet Commonly known as:  AMBIEN CR     TAKE these medications   acetaminophen 325 MG tablet Commonly known as:  TYLENOL Take 325 mg by mouth every 6 (six) hours as needed for moderate pain.   cholecalciferol 1000 units tablet Commonly known as:  VITAMIN D Take 1,000 Units by mouth daily.   ferrous sulfate 325 (65 FE) MG tablet Take 1 tablet (325 mg total) by mouth 2 (two) times daily with a meal.   folic acid 1 MG tablet Commonly known as:  FOLVITE Take 1 mg by mouth daily.    multivitamin-prenatal 27-0.8 MG Tabs tablet Take 1 tablet by mouth daily at 12 noon.       Diet: routine diet  Activity: Advance as tolerated. Pelvic rest for 6 weeks.   Outpatient follow up:2 weeks Follow up Appt:No future appointments. Follow up Visit:No Follow-up on file.  Postpartum contraception: Undecided  Newborn Data:   Orlie Dakinutt, BoyA Alleyah [[id number]][[id number]]  Live born female  Birth Weight: 5 lb 8.4 oz (2506 g) APGAR: 9, 9  Newborn Delivery   Birth date/time:  01/06/2017 03:29:00 Delivery type:  Vaginal, Spontaneous      Linford Arnoldutt, BoyB Janasia [[id number]][[id number]]  Live born female  Birth Weight: 5 lb 11.8 oz (2603 g) APGAR: 9, 9  Newborn Delivery   Birth date/time:  01/06/2017 03:51:00 Delivery type:  Vaginal, Spontaneous     Baby Feeding: both Disposition:home with mother   01/07/2017 Marthenia RollingScott Bland, DO .  I was present for the exam and agree with the above. Babies are not being discharged, so CNM cancelled Mom's discharge.    Katrinka BlazingSmith, [place], [place] 01/07/2017 12:26 PM

## 2017-01-08 ENCOUNTER — Inpatient Hospital Stay (HOSPITAL_COMMUNITY): Admission: RE | Admit: 2017-01-08 | Payer: Medicaid Other | Source: Ambulatory Visit

## 2017-01-08 NOTE — Discharge Summary (Signed)
OB Discharge Summary *patient was held extra day after planned d/c yesterday, there are now two d/c summaries in this encounter*     Patient Name: Kathleen Esparza DOB: [birth year] MRN: [id number]  Date of admission: 01/05/2017 Delivering MD:    Orlie Dakinutt, BoyA Jakiah [[id number]][[id number]]  MOSS, AMBER   Linford Arnoldutt, BoyB Vernell [[id number]][[id number]]  MOSS, AMBER   Date of discharge: 01/08/2017  Admitting diagnosis: 37.4WKS TWINS BABY A BPP 4 OUT OF 8 Intrauterine pregnancy: 5076w6d     Secondary diagnosis:  Active Problems:   Encounter for induction of labor   SVD (spontaneous vaginal delivery)  Additional problems: none     Discharge diagnosis: Term SVD, twins                                                                                        Post partum procedures:n/a  Augmentation: Pitocin  Complications: None  Hospital course:  Induction of Labor With Vaginal Delivery   26 y.o. yo [id number] at 5876w6d was admitted to the hospital 01/05/2017 for induction of labor.  Indication for induction: BPP 4/8 for baby A.  Patient had an uncomplicated labor course as follows: Membrane Rupture Time/Date:    Orlie Dakinutt, BoyA Fatoumata [[id number]][[id number]]  1:00 AM   Linford Arnoldutt, BoyB Ayana [[id number]][[id number]]  3:34 AM ,   Orlie Dakinutt, BoyA Katherene [[id number]][[id number]]  01/06/2017   Linford Arnoldutt, BoyB Raniya [[id number]][[id number]]  01/06/2017   Intrapartum Procedures: Episiotomy:    Orlie Dakinutt, BoyA Elaynah [[id number]][[id number]]  None [1]   Gerilyn Pilgrimutt, BoyB Ivoree [[id number]][[id number]]  None [1]                                         Lacerations:     Orlie Dakinutt, BoyA Milanni [[id number]][[id number]]  Labial [10]   Linford Arnoldutt, BoyB Derriona [[id number]][[id number]]  None [1]  Patient had delivery of a Viable infant.  Information for the patient's newborn:  Orlie Dakinutt, BoyA Janelis [[id number]][[id number]]  Delivery Method: Vaginal, Spontaneous(Filed from Delivery Summary) Information for the patient's newborn:  Linford Arnoldutt, BoyB Esli [[id number]][[id number]]  Delivery Method: Vaginal, Spontaneous(Filed from Delivery Summary)     Orlie Dakinutt, BoyA  Karletta [[id number]][[id number]]  01/06/2017   Linford Arnoldutt, BoyB Jonathan [[id number]][[id number]]  01/06/2017  Details of delivery can be found in separate delivery note.  Patient had a routine postpartum course. Patient is discharged home 01/08/17.  Physical exam  Vitals:   01/06/17 1745 01/07/17 0600 01/07/17 1803 01/08/17 0519  BP: 111/64 (!) 103/56 117/81 123/73  Pulse: 72 63 67 63  Resp: 16 18 18 16   Temp: 98.4 F (36.9 C)  98.5 F (36.9 C)   TempSrc: Oral  Oral   SpO2:  98%    Weight:      Height:       General: alert, cooperative and no distress Lochia: appropriate Uterine Fundus: firm Incision: N/A DVT Evaluation: No evidence of DVT seen on physical exam. Labs: Lab Results  Component Value Date   WBC 11.9 (H) 01/05/2017   HGB 11.9 (L) 01/05/2017   HCT 34.6 (L) 01/05/2017   MCV 92.8 01/05/2017   PLT 251  01/05/2017   CMP Latest Ref Rng & Units 01/09/2015  Glucose 65 - 99 mg/dL 540(J101(H)  BUN 6 - 20 mg/dL 10  Creatinine 8.110.44 - 9.141.00 mg/dL 7.820.89  Sodium 956135 - 213145 mmol/L 138  Potassium 3.5 - 5.1 mmol/L 3.8  Chloride 101 - 111 mmol/L 106  CO2 22 - 32 mmol/L 27  Calcium 8.9 - 10.3 mg/dL 8.9  Total Protein 6.5 - 8.1 g/dL 6.4(L)  Total Bilirubin 0.3 - 1.2 mg/dL 0.4  Alkaline Phos 38 - 126 U/L 65  AST 15 - 41 U/L 17  ALT 14 - 54 U/L 13(L)    Discharge instruction: per After Visit Summary and "Baby and Me Booklet".  After visit meds:  Allergies as of 01/08/2017   No Known Allergies     Medication List    STOP taking these medications   zolpidem 12.5 MG CR tablet Commonly known as:  AMBIEN CR     TAKE these medications   acetaminophen 325 MG tablet Commonly known as:  TYLENOL Take 325 mg by mouth every 6 (six) hours as needed for moderate pain.   cholecalciferol 1000 units tablet Commonly known as:  VITAMIN D Take 1,000 Units by mouth daily.   ferrous sulfate 325 (65 FE) MG tablet Take 1 tablet (325 mg total) by mouth 2 (two) times daily with a meal.   folic acid 1 MG  tablet Commonly known as:  FOLVITE Take 1 mg by mouth daily.   multivitamin-prenatal 27-0.8 MG Tabs tablet Take 1 tablet by mouth daily at 12 noon.       Diet: routine diet  Activity: Advance as tolerated. Pelvic rest for 6 weeks.   Outpatient follow up:2 weeks Follow up Appt: Future Appointments  Date Time Provider Department Center  02/07/2017  1:00 PM Sharyon Cableogers, Veronica C, CNM [facility] None    Postpartum contraception: Nexplanon  Newborn Data:   Orlie Dakinutt, BoyA Meyer [[id number]][[id number]]  Live born female  Birth Weight: 5 lb 8.4 oz (2506 g) APGAR: 9, 9  Newborn Delivery   Birth date/time:  01/06/2017 03:29:00 Delivery type:  Vaginal, Spontaneous      Linford Arnoldutt, BoyB Kerly [[id number]][[id number]]  Live born female  Birth Weight: 5 lb 11.8 oz (2603 g) APGAR: 9, 9  Newborn Delivery   Birth date/time:  01/06/2017 03:51:00 Delivery type:  Vaginal, Spontaneous     Baby Feeding: both Disposition:home with mother   Marthenia RollingBland, Scott, DO 01/08/2017 8:48 AM  OB FELLOW DISCHARGE ATTESTATION  I have seen and examined this patient. I agree with above documentation and have made edits as needed.   Caryl AdaJazma Phelps, DO OB Fellow

## 2017-01-11 ENCOUNTER — Ambulatory Visit (INDEPENDENT_AMBULATORY_CARE_PROVIDER_SITE_OTHER): Payer: Medicaid Other | Admitting: Obstetrics

## 2017-01-11 ENCOUNTER — Encounter: Payer: Self-pay | Admitting: Obstetrics

## 2017-01-11 ENCOUNTER — Other Ambulatory Visit: Payer: Self-pay

## 2017-01-11 DIAGNOSIS — R030 Elevated blood-pressure reading, without diagnosis of hypertension: Secondary | ICD-10-CM

## 2017-01-11 DIAGNOSIS — S01512S Laceration without foreign body of oral cavity, sequela: Secondary | ICD-10-CM

## 2017-01-11 DIAGNOSIS — R52 Pain, unspecified: Secondary | ICD-10-CM

## 2017-01-11 DIAGNOSIS — O9089 Other complications of the puerperium, not elsewhere classified: Secondary | ICD-10-CM

## 2017-01-11 MED ORDER — OXYCODONE-ACETAMINOPHEN 5-325 MG PO TABS: 1.0000 | ORAL_TABLET | ORAL | 0 refills | Status: DC | PRN

## 2017-01-11 MED ORDER — ACETAMINOPHEN 325 MG PO TABS: 325.0000 mg | ORAL_TABLET | Freq: Four times a day (QID) | ORAL | 0 refills | Status: AC | PRN

## 2017-01-11 MED ORDER — IBUPROFEN 800 MG PO TABS: 800.0000 mg | ORAL_TABLET | Freq: Three times a day (TID) | ORAL | 5 refills | Status: DC | PRN

## 2017-01-11 NOTE — Progress Notes (Signed)
Post Partum Day 5 Subjective: Complains of cramping and vaginal pain.   Objective: Blood pressure (!) 179/106, pulse 60, weight 204 lb 3.2 oz (92.6 kg), last menstrual period 03/24/2016. Repeat BP 160 / 82  Physical Exam:  General: Alert and no distress  Lochia: appropriate Uterine Fundus: firm Labial laceration: healing well DVT Evaluation: No evidence of DVT seen on physical exam. No significant calf/ankle edema.   Assessment/Plan:  1. Postpartum care following vaginal delivery - S/P NSVD of twins, PPD#4   2. Laceration of labial mucosa without complication, sequela - painful healing - continue warm sitz baths with Epsom Salts   3. Postpartum pain Rx: - oxyCODONE-acetaminophen (PERCOCET/ROXICET) 5-325 MG tablet; Take 1-2 tablets by mouth every 4 (four) hours as needed for severe pain.  Dispense: 20 tablet; Refill: 0 - ibuprofen (ADVIL,MOTRIN) 800 MG tablet; Take 1 tablet (800 mg total) by mouth every 8 (eight) hours as needed.  Dispense: 30 tablet; Refill: 5  4. Elevated BP without diagnosis of hypertension - probably related pain - will follow up in office for BP check on Monday   Coral CeoCharles Harper 01/11/2017, 11:40 AM

## 2017-01-11 NOTE — Progress Notes (Signed)
Patient is in the office reporting pain and swelling on right labia majora that was repaired after vaginal delivery on 01-06-17. Patient states that pain is making it hard to walk and sleep comfortably. Pt has elevated BP with swelling in ankles, denies headaches, blurred vision and dizziness.

## 2017-01-15 ENCOUNTER — Ambulatory Visit: Payer: Medicaid Other

## 2017-01-15 VITALS — BP 148/96

## 2017-01-15 DIAGNOSIS — Z0130 Encounter for examination of blood pressure without abnormal findings: Secondary | ICD-10-CM

## 2017-01-15 MED ORDER — HYDROCHLOROTHIAZIDE 25 MG PO TABS: 25.0000 mg | ORAL_TABLET | Freq: Every day | ORAL | 3 refills | Status: DC

## 2017-01-15 NOTE — Progress Notes (Signed)
Agree with nursing staff's documentation of this patient's clinic encounter.  Catalina AntiguaPeggy Lyndee Herbst, MD 01/15/2017 1:05 PM

## 2017-01-15 NOTE — Progress Notes (Addendum)
Subjective:  Kathleen Esparza is a 26 y.o. female with hypertension.   Hypertension ROS: no TIA's, no chest pain on exertion, no dyspnea on exertion and no swelling of ankles.  New concerns: None per pt.   Objective:  BP (!) 148/96   LMP 03/24/2016 (Approximate) Comment: implanon removed in Feb, never had a cycle  Breastfeeding? Unknown   Appearance alert, well appearing, and in no distress. General exam BP noted to be well controlled today in office.    Assessment:   Hypertension Needs Improvement.   Plan:  Pt to start HCTZ 25 mg 1 tab po qd. We will follow up during pp visit 12/26

## 2017-02-07 ENCOUNTER — Ambulatory Visit (INDEPENDENT_AMBULATORY_CARE_PROVIDER_SITE_OTHER): Payer: Medicaid Other | Admitting: Certified Nurse Midwife

## 2017-02-07 ENCOUNTER — Encounter: Payer: Self-pay | Admitting: Certified Nurse Midwife

## 2017-02-07 ENCOUNTER — Encounter: Payer: Self-pay | Admitting: *Deleted

## 2017-02-07 ENCOUNTER — Other Ambulatory Visit (HOSPITAL_COMMUNITY)
Admission: RE | Admit: 2017-02-07 | Discharge: 2017-02-07 | Disposition: A | Discharge status: Home / Self Care | Payer: Medicaid Other | Source: Ambulatory Visit | Attending: Certified Nurse Midwife | Admitting: Certified Nurse Midwife

## 2017-02-07 DIAGNOSIS — Z30017 Encounter for initial prescription of implantable subdermal contraceptive: Secondary | ICD-10-CM | POA: Insufficient documentation

## 2017-02-07 DIAGNOSIS — N898 Other specified noninflammatory disorders of vagina: Secondary | ICD-10-CM

## 2017-02-07 DIAGNOSIS — Z3202 Encounter for pregnancy test, result negative: Secondary | ICD-10-CM

## 2017-02-07 DIAGNOSIS — Z1389 Encounter for screening for other disorder: Secondary | ICD-10-CM

## 2017-02-07 DIAGNOSIS — Z3049 Encounter for surveillance of other contraceptives: Secondary | ICD-10-CM

## 2017-02-07 LAB — POCT URINE PREGNANCY: Preg Test, Ur: NEGATIVE

## 2017-02-07 MED ORDER — ETONOGESTREL 68 MG ~~LOC~~ IMPL
68.0000 mg | DRUG_IMPLANT | Freq: Once | SUBCUTANEOUS | Status: AC
  Administered 2017-02-07: 68 mg via SUBCUTANEOUS

## 2017-02-07 NOTE — Patient Instructions (Signed)
Nexplanon Instructions After Insertion   Keep bandage clean and dry for 24 hours   May use ice/Tylenol/Ibuprofen for soreness or pain   If you develop fever, drainage or increased warmth from incision site-contact office immediately  Etonogestrel implant What is this medicine? ETONOGESTREL (et oh noe JES trel) is a contraceptive (birth control) device. It is used to prevent pregnancy. It can be used for up to 3 years. This medicine may be used for other purposes; ask your health care provider or pharmacist if you have questions. COMMON BRAND NAME(S): Implanon, Nexplanon What should I tell my health care provider before I take this medicine? They need to know if you have any of these conditions: -abnormal vaginal bleeding -blood vessel disease or blood clots -cancer of the breast, cervix, or liver -depression -diabetes -gallbladder disease -headaches -heart disease or recent heart attack -high blood pressure -high cholesterol -kidney disease -liver disease -renal disease -seizures -tobacco smoker -an unusual or allergic reaction to etonogestrel, other hormones, anesthetics or antiseptics, medicines, foods, dyes, or preservatives -pregnant or trying to get pregnant -breast-feeding How should I use this medicine? This device is inserted just under the skin on the inner side of your upper arm by a health care professional. Talk to your pediatrician regarding the use of this medicine in children. Special care may be needed. Overdosage: If you think you have taken too much of this medicine contact a poison control center or emergency room at once. NOTE: This medicine is only for you. Do not share this medicine with others. What if I miss a dose? This does not apply. What may interact with this medicine? Do not take this medicine with any of the following medications: -amprenavir -bosentan -fosamprenavir This medicine may also interact with the following  medications: -barbiturate medicines for inducing sleep or treating seizures -certain medicines for fungal infections like ketoconazole and itraconazole -grapefruit juice -griseofulvin -medicines to treat seizures like carbamazepine, felbamate, oxcarbazepine, phenytoin, topiramate -modafinil -phenylbutazone -rifampin -rufinamide -some medicines to treat HIV infection like atazanavir, indinavir, lopinavir, nelfinavir, tipranavir, ritonavir -St. John's wort This list may not describe all possible interactions. Give your health care provider a list of all the medicines, herbs, non-prescription drugs, or dietary supplements you use. Also tell them if you smoke, drink alcohol, or use illegal drugs. Some items may interact with your medicine. What should I watch for while using this medicine? This product does not protect you against HIV infection (AIDS) or other sexually transmitted diseases. You should be able to feel the implant by pressing your fingertips over the skin where it was inserted. Contact your doctor if you cannot feel the implant, and use a non-hormonal birth control method (such as condoms) until your doctor confirms that the implant is in place. If you feel that the implant may have broken or become bent while in your arm, contact your healthcare provider. What side effects may I notice from receiving this medicine? Side effects that you should report to your doctor or health care professional as soon as possible: -allergic reactions like skin rash, itching or hives, swelling of the face, lips, or tongue -breast lumps -changes in emotions or moods -depressed mood -heavy or prolonged menstrual bleeding -pain, irritation, swelling, or bruising at the insertion site -scar at site of insertion -signs of infection at the insertion site such as fever, and skin redness, pain or discharge -signs of pregnancy -signs and symptoms of a blood clot such as breathing problems; changes in  vision; chest pain; severe,   sudden headache; pain, swelling, warmth in the leg; trouble speaking; sudden numbness or weakness of the face, arm or leg -signs and symptoms of liver injury like dark yellow or brown urine; general ill feeling or flu-like symptoms; light-colored stools; loss of appetite; nausea; right upper belly pain; unusually weak or tired; yellowing of the eyes or skin -unusual vaginal bleeding, discharge -signs and symptoms of a stroke like changes in vision; confusion; trouble speaking or understanding; severe headaches; sudden numbness or weakness of the face, arm or leg; trouble walking; dizziness; loss of balance or coordination Side effects that usually do not require medical attention (report to your doctor or health care professional if they continue or are bothersome): -acne -back pain -breast pain -changes in weight -dizziness -general ill feeling or flu-like symptoms -headache -irregular menstrual bleeding -nausea -sore throat -vaginal irritation or inflammation This list may not describe all possible side effects. Call your doctor for medical advice about side effects. You may report side effects to FDA at 1-800-FDA-1088. Where should I keep my medicine? This drug is given in a hospital or clinic and will not be stored at home. NOTE: This sheet is a summary. It may not cover all possible information. If you have questions about this medicine, talk to your doctor, pharmacist, or health care provider.  2018 Elsevier/Gold Standard (2015-08-19 11:19:22)  

## 2017-02-07 NOTE — Progress Notes (Signed)
Post Partum Exam  Kathleen Esparza is a 26 y.o. [phone] female who presents for a postpartum visit. She is 4 weeks postpartum following a spontaneous vaginal delivery of twins. I have fully reviewed the prenatal and intrapartum course. The delivery was at [id number] gestational weeks.  Anesthesia: epidural. Postpartum course has been unremarkable. Baby's course has been unremarkable. Babies are feeding by bottle - Similac Neosure. Bleeding no bleeding. Bowel function is normal. Bladder function is normal. Patient is not sexually active. Contraception method is Nexplanon- placed at today's visit. Postpartum depression screening:neg. Patient complains of vaginal discharge with itching.   The following portions of the patient's history were reviewed and updated as appropriate: past family history and past social history.  Review of Systems Pertinent items noted in HPI and remainder of comprehensive ROS otherwise negative.    Objective:  Last menstrual period 03/24/2016, unknown if currently breastfeeding.  General:  alert, cooperative and no distress   Breasts:  negative  Lungs: clear to auscultation bilaterally  Heart:  regular rate and rhythm, S1, S2 normal, no murmur, click, rub or gallop  Abdomen: soft, non-tender; bowel sounds normal; no masses,  no organomegaly   Vulva:  normal  Vagina: vagina positive for white, thick discharge   Cervix:  not evaluated  Corpus: not examined  Adnexa:  not evaluated  Rectal Exam: Not performed.        Assessment/Plan:   1. Encounter for routine postpartum follow-up -Normal postpartum exam with scant vaginal discharge. Pap smear not done at today's visit- last PAP on 07/05/16 was negative.   2. Encounter for initial prescription of Nexplanon -Patient desires contraception, had Nexplanon prior to pregnancy with no issues or concerns.  -Discussed risks and benefits of contraceptions- separate procedure note  - POCT urine pregnancy- negative  - etonogestrel  (NEXPLANON) implant 68 mg  3. Vaginal discharge -Scant white thick discharge with no odor present on vaginal walls  - Cervicovaginal ancillary only  Follow up in 1 year for well woman exam or as needed  Will call patient if positive result of cervicovaginal ancillary swab   Sharyon CableVeronica C Nehemiah Montee, CNM 02/07/17, 2:06 PM

## 2017-02-07 NOTE — Progress Notes (Signed)
GYNECOLOGY CLINIC PROCEDURE NOTE  [name] is a 26 y.o. [phone] here for Nexplanon insertion. No GYN concerns.  Last pap smear was on 06/2016 and was normal.  No other gynecologic concerns. Recently delivery of twins- 4 weeks postpartum.   Nexplanon Insertion Procedure Patient was given informed consent, she signed consent form.  Patient does understand that irregular bleeding is a very common side effect of this medication. She was advised to have backup contraception for one week after placement. Pregnancy test in clinic today was negative.  Appropriate time out taken.  Patient's right arm was prepped and draped in the usual sterile fashion.. The ruler used to measure and mark insertion area.  Patient was prepped with alcohol swab and then injected with 3 ml of 1% lidocaine.  She was prepped with betadine, Nexplanon removed from packaging,  Device confirmed in needle, then inserted full length of needle and withdrawn per handbook instructions. Nexplanon was able to palpated in the patient's arm; patient palpated the insert herself. There was minimal blood loss.  Patient insertion site covered with guaze and a pressure bandage to reduce any bruising.  The patient tolerated the procedure well and was given post procedure instructions.    Kathleen Esparza, Kathleen Esparza, CNM 02/07/2017 1:57 PM

## 2017-02-08 LAB — CERVICOVAGINAL ANCILLARY ONLY
Bacterial vaginitis: POSITIVE — AB
Candida vaginitis: NEGATIVE
Chlamydia: NEGATIVE
Neisseria Gonorrhea: NEGATIVE
Trichomonas: NEGATIVE

## 2017-02-09 ENCOUNTER — Other Ambulatory Visit: Payer: Self-pay | Admitting: Certified Nurse Midwife

## 2017-02-09 DIAGNOSIS — N760 Acute vaginitis: Principal | ICD-10-CM

## 2017-02-09 DIAGNOSIS — B9689 Other specified bacterial agents as the cause of diseases classified elsewhere: Secondary | ICD-10-CM

## 2017-02-09 MED ORDER — METRONIDAZOLE 500 MG PO TABS: 500.0000 mg | ORAL_TABLET | Freq: Two times a day (BID) | ORAL | 0 refills | Status: DC

## 2017-02-09 NOTE — Progress Notes (Signed)
Patient was seen in the office on Wednesday 12/26 for PP visit and Nexplanon placement. Patient complained of vaginal discharge with itching. Cervicovaginal ancillary swab was obtained and resulted in positive Bacterial vaginitis. Flagyl sent to pharmacy on file and MyChart message sent to patient for notification.   Sharyon CableVeronica C Haily Caley, CNM 02/09/17, 8:15 AM

## 2017-02-12 ENCOUNTER — Telehealth: Payer: Self-pay

## 2017-02-12 NOTE — Telephone Encounter (Signed)
Moses MannersJaime Church (nurse from [employer] [facility]) called and advised that pt called her crying and stated that she was depressed....scheduled pt for pp follow up on 02-14-17, pt voicemail is full, sent mychart message and called alternate number and left message with grandmother.

## 2017-02-14 ENCOUNTER — Encounter: Payer: Self-pay | Admitting: Obstetrics

## 2017-02-14 ENCOUNTER — Ambulatory Visit (INDEPENDENT_AMBULATORY_CARE_PROVIDER_SITE_OTHER): Payer: Medicaid Other | Admitting: Obstetrics

## 2017-02-14 VITALS — BP 162/106 | HR 65 | Wt 192.2 lb

## 2017-02-14 DIAGNOSIS — F53 Postpartum depression: Secondary | ICD-10-CM | POA: Diagnosis not present

## 2017-02-14 DIAGNOSIS — Z3046 Encounter for surveillance of implantable subdermal contraceptive: Secondary | ICD-10-CM

## 2017-02-14 DIAGNOSIS — O99345 Other mental disorders complicating the puerperium: Principal | ICD-10-CM
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
## 2017-02-15 NOTE — Telephone Encounter (Signed)
error 

## 2017-05-14 ENCOUNTER — Telehealth: Payer: Self-pay

## 2017-05-14 NOTE — Telephone Encounter (Signed)
Pt called stating that she needs a letter stating that she had bp issues, and issues with postpartum depression for school. School is also requesting dates of treatments. Pt states that she will call me back with exactly what is needed

## 2017-05-17 ENCOUNTER — Telehealth: Payer: Self-pay

## 2017-05-17 NOTE — Telephone Encounter (Signed)
Pt called requesting a letter stating dates of service for the months of Oct 2018-March 2019. Letter needs to state that pt was under our care due to supervision of pregnancy. This letter needs to be faxed to [phone] Attn: Rosaland Laoean Reid. This is a Physicist, medicalletter for school. Letter was faxed

## 2017-05-21 ENCOUNTER — Telehealth: Payer: Self-pay

## 2017-05-21 NOTE — Telephone Encounter (Signed)
Patient called requesting a copy of the letter that was faxed to her School, she will pick it up today.

## 2017-05-22 ENCOUNTER — Telehealth: Payer: Self-pay

## 2017-05-22 NOTE — Telephone Encounter (Signed)
Patient is unable to pick letter up, she wants it faxed to her School

## 2017-08-20 ENCOUNTER — Emergency Department (HOSPITAL_COMMUNITY): Payer: Medicaid Other

## 2017-08-20 ENCOUNTER — Other Ambulatory Visit: Payer: Self-pay

## 2017-08-20 ENCOUNTER — Emergency Department (HOSPITAL_COMMUNITY)
Admission: EM | Admit: 2017-08-20 | Discharge: 2017-08-20 | Disposition: A | Discharge status: Home / Self Care | Payer: Medicaid Other | Attending: Emergency Medicine | Admitting: Emergency Medicine

## 2017-08-20 ENCOUNTER — Encounter (HOSPITAL_COMMUNITY): Payer: Self-pay

## 2017-08-20 DIAGNOSIS — Y929 Unspecified place or not applicable: Secondary | ICD-10-CM | POA: Diagnosis not present

## 2017-08-20 DIAGNOSIS — S6992XA Unspecified injury of left wrist, hand and finger(s), initial encounter: Secondary | ICD-10-CM

## 2017-08-20 DIAGNOSIS — Y939 Activity, unspecified: Secondary | ICD-10-CM | POA: Insufficient documentation

## 2017-08-20 DIAGNOSIS — Y999 Unspecified external cause status: Secondary | ICD-10-CM | POA: Diagnosis not present

## 2017-08-20 DIAGNOSIS — Z87891 Personal history of nicotine dependence: Secondary | ICD-10-CM | POA: Insufficient documentation

## 2017-08-20 DIAGNOSIS — Z79899 Other long term (current) drug therapy: Secondary | ICD-10-CM | POA: Insufficient documentation

## 2017-08-20 DIAGNOSIS — S0083XA Contusion of other part of head, initial encounter: Secondary | ICD-10-CM | POA: Diagnosis not present

## 2017-08-20 DIAGNOSIS — T1490XA Injury, unspecified, initial encounter: Secondary | ICD-10-CM
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
## 2017-08-20 NOTE — ED Notes (Signed)
Pt is back from X ray.

## 2017-08-20 NOTE — Discharge Instructions (Addendum)
Please read attached information. If you experience any new or worsening signs or symptoms please return to the emergency room for evaluation. Please follow-up with your primary care provider or specialist as discussed.  Please use Tylenol or ibuprofen as needed for discomfort. °

## 2017-08-20 NOTE — ED Notes (Signed)
Pt went to X-ray.   

## 2018-08-09 IMAGING — US US MFM OB LIMITED
1 series · 15 of 28 positions shown · non-contrast
Comparison: none

[Series 1: us mfm ob limited · 38 acquisitions, 15 frames shown]
[im 1/38]
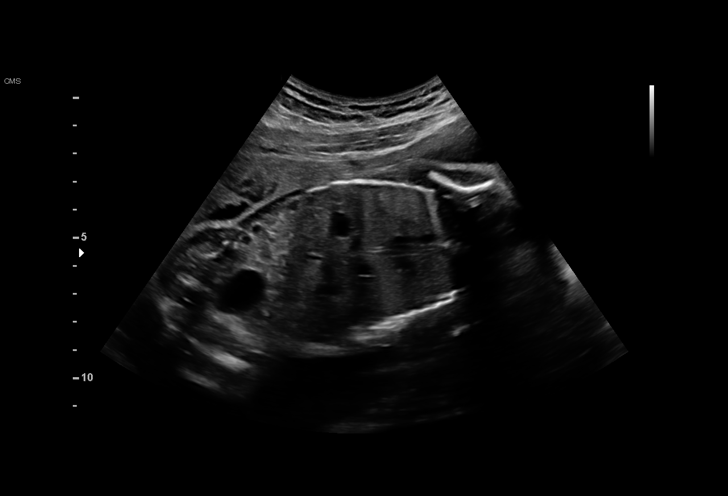
[im 3/38]
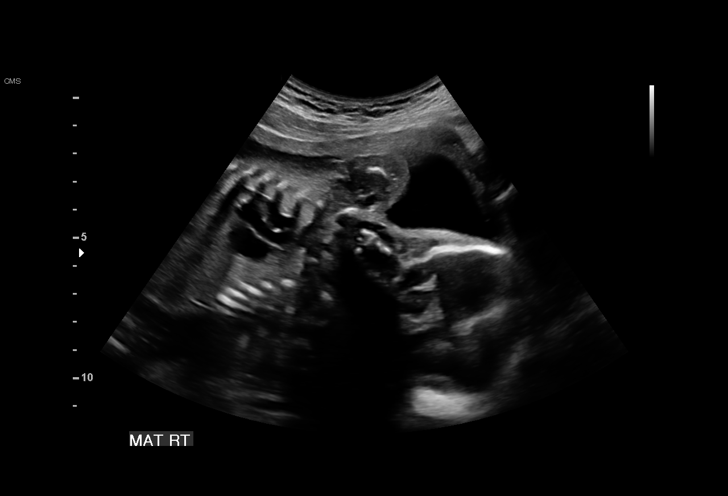
[im 6/38]
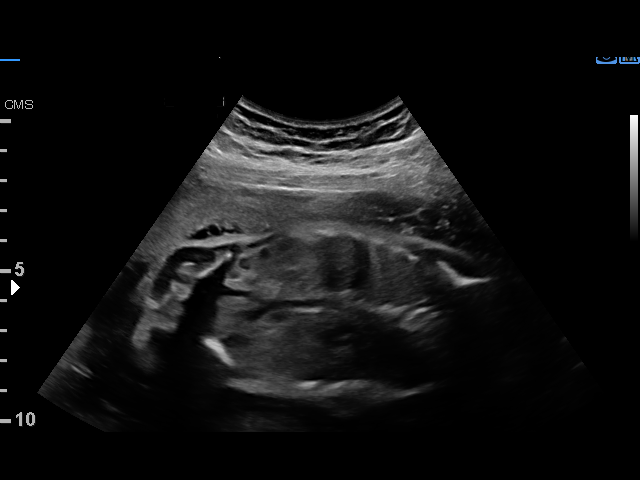
[im 9/38]
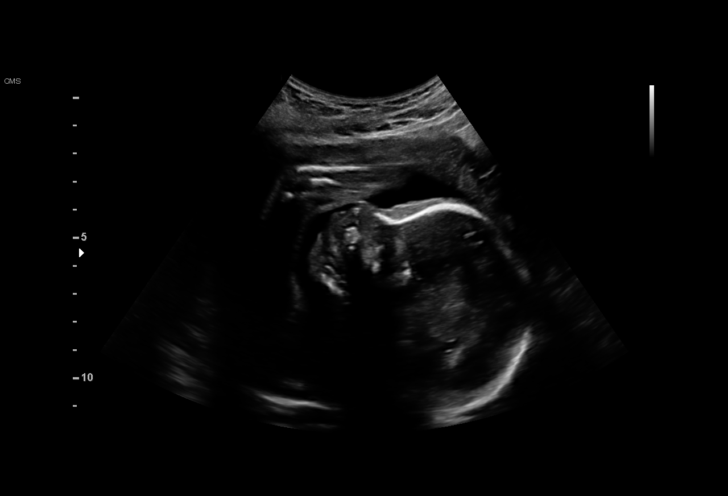
[im 11/38]
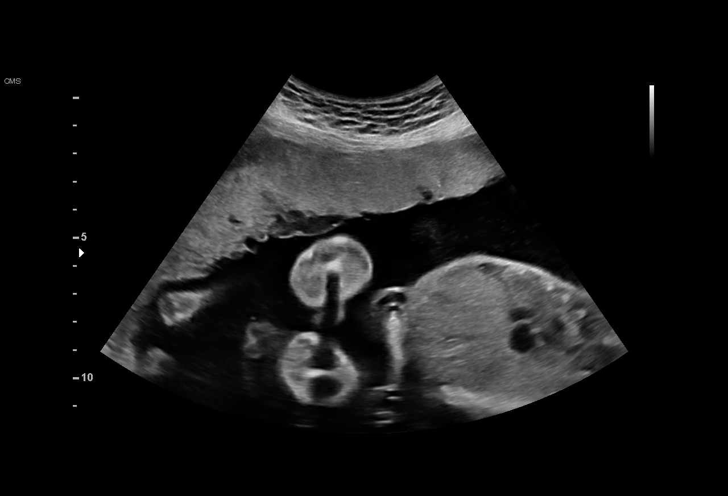
[im 14/38]
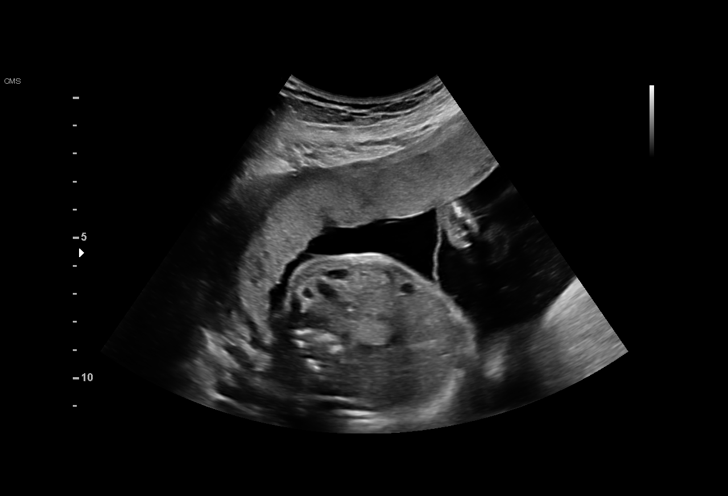
[im 17/38]
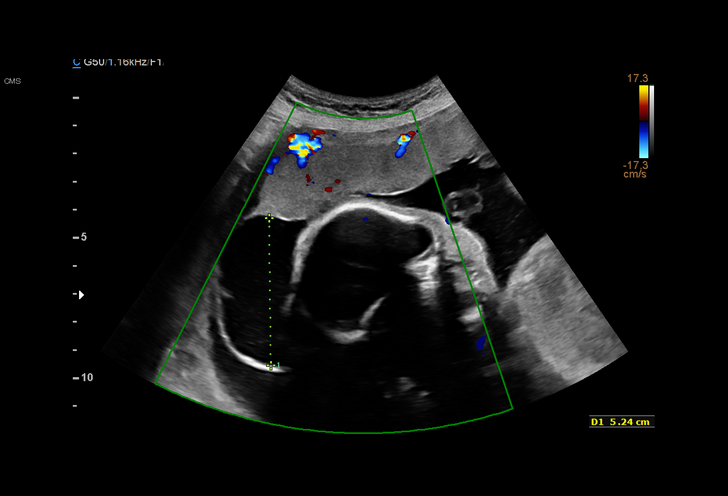
[im 20/38]
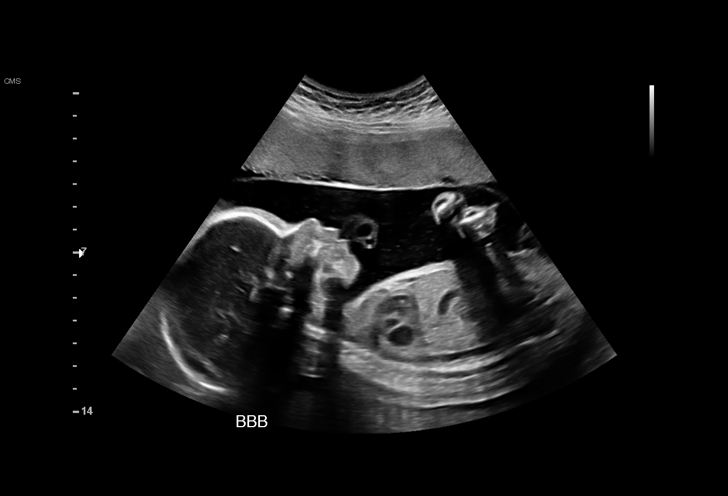
[im 21/38]
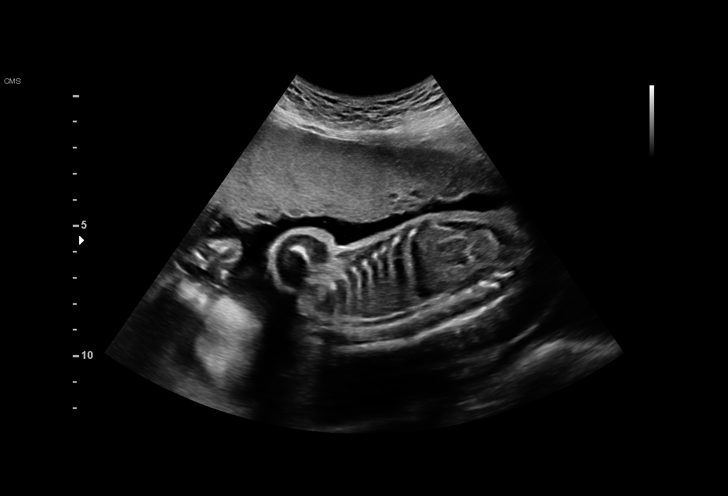
[im 24/38]
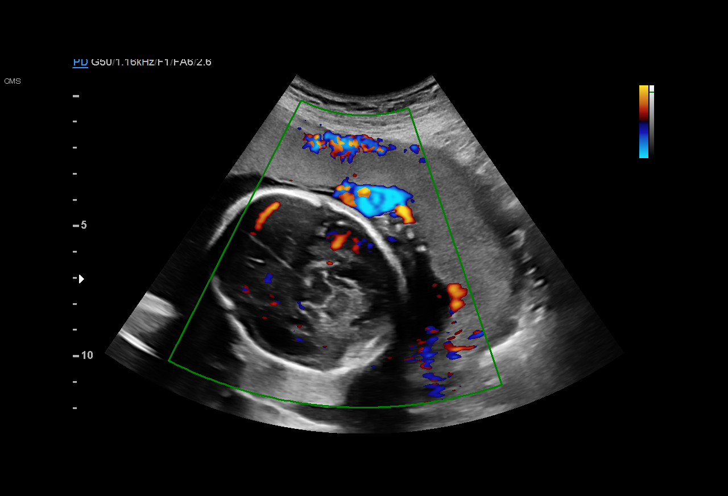
[im 27/38]
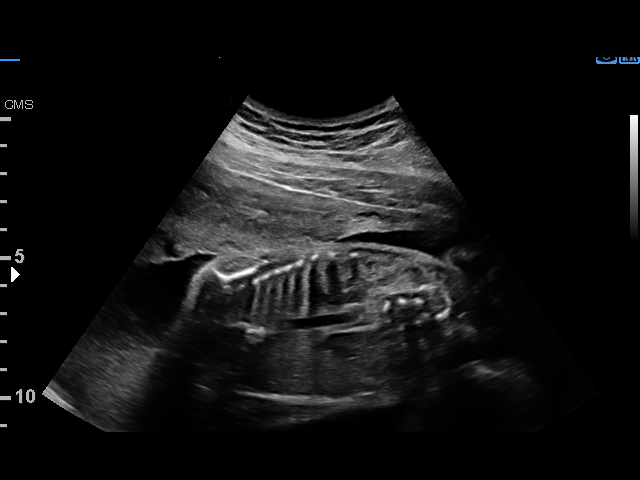
[im 29/38]
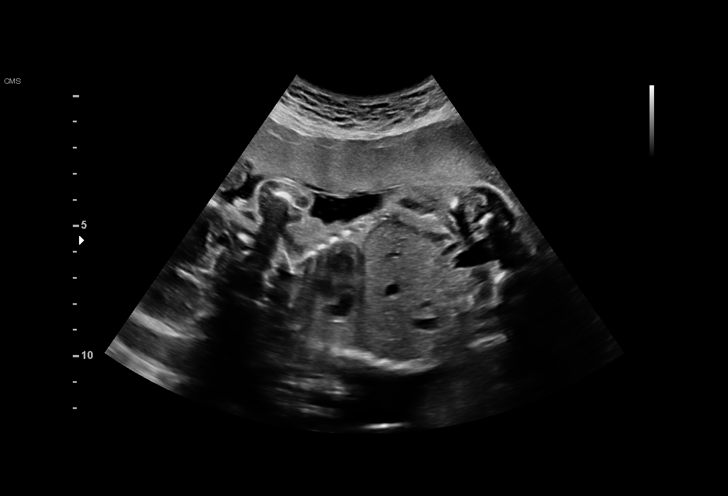
[im 32/38]
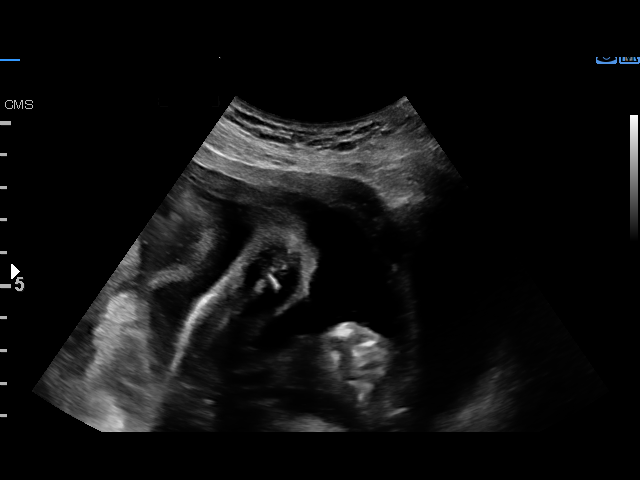
[im 35/38]
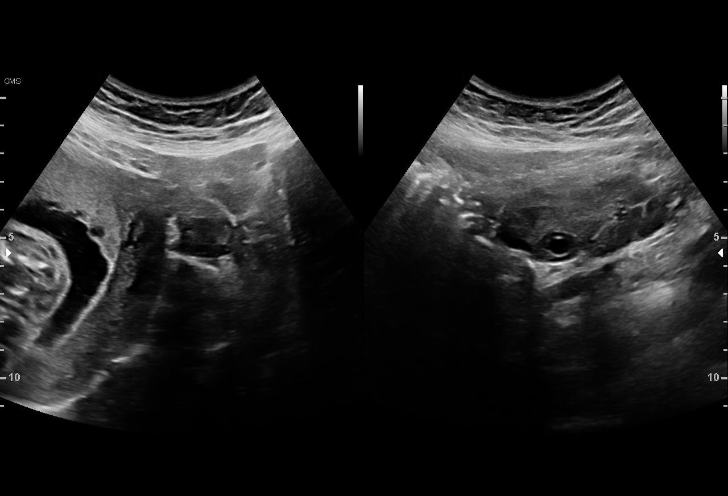
[im 38/38]
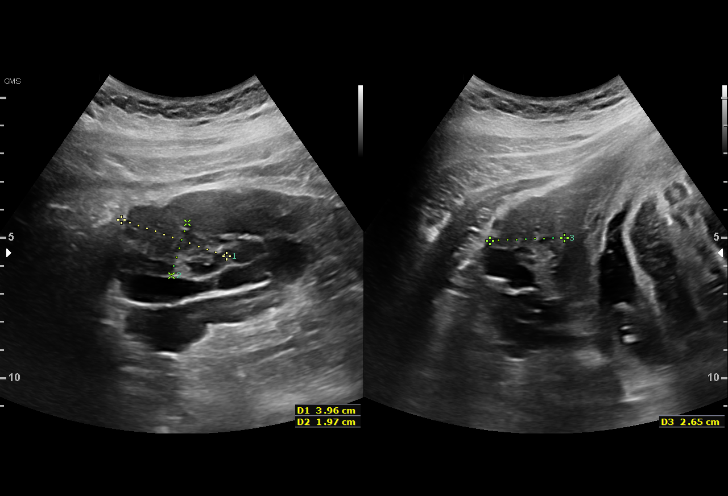

[15 of 28 positions shown; findings below may reference images not displayed]

MAU/Triage
[REDACTED]care -
[HOSPITAL]([HOSPITAL]
)

1  SUHED BAIRAGEE         [id number]      [id number]     [id number]
Indications

25 weeks gestation of pregnancy
Abdominal pain in pregnancy
Twin pregnancy, di/di, second trimester
OB History

Blood Type:            Height:  5'4"   Weight (lb):  192      BMI:
Gravidity:    3         Term:   1        Prem:   0        SAB:   0
TOP:          1       Ectopic:  0        Living: 1
Fetal Evaluation (Fetus A)

Num Of Fetuses:     2
Fetal Heart         171
Rate(bpm):
Cardiac Activity:   Observed
Fetal Lie:          Maternal right side
Presentation:       Cephalic
Placenta:           Anterior, above cervical os
P. Cord Insertion:  Previously Visualized

Amniotic Fluid
AFI FV:      Subjectively within normal limits

Largest Pocket(cm)
5.3
Gestational Age (Fetus A)

LMP:           28w 3d       Date:   03/24/16                 EDD:   12/29/16
Best:          25w 0d    Det. By:   Early Ultrasound         EDD:   01/22/17
(06/05/16)

Fetal Evaluation (Fetus B)

Num Of Fetuses:     2
Fetal Heart         154
Rate(bpm):
Cardiac Activity:   Observed
Fetal Lie:          Maternal left side
Presentation:       Breech
Placenta:           Anterior, above cervical os
P. Cord Insertion:  Visualized

Amniotic Fluid
AFI FV:      Subjectively within normal limits

Largest Pocket(cm)
5.24

Comment:    Active fetus x 2. Stomach and bladder visualized x 2.
Gestational Age (Fetus B)

LMP:           28w 3d       Date:   03/24/16                 EDD:   12/29/16
Best:          25w 0d    Det. By:   Early Ultrasound         EDD:   01/22/17
(06/05/16)
Cervix Uterus Adnexa

Cervix
Length:            3.5  cm.
Normal appearance by transabdominal scan.

Uterus
No abnormality visualized.

Left Ovary
Within normal limits.

Right Ovary
Within normal limits.

Cul De Sac:   No free fluid seen.

Adnexa:       No abnormality visualized.
Impression

Diamniotic Dichorionic intrauterine pregnancy at 25+0 weeks
weeks
Presentation is cephalic/breech
Normal amniotic fluid in each sac, with  MVP of 5.3 cm for A
and 5.2 cm for B
Normal fetal movement and cardiac activity both twins
Recommendations

A BPP was requested for this patient; BPP is not valid at 25
weeks
Continue clinical evaluation and management

## 2018-09-22 ENCOUNTER — Emergency Department (HOSPITAL_COMMUNITY)
Admission: EM | Admit: 2018-09-22 | Discharge: 2018-09-22 | Disposition: A | Discharge status: Home / Self Care | Payer: Medicaid Other | Attending: Emergency Medicine | Admitting: Emergency Medicine

## 2018-09-22 ENCOUNTER — Other Ambulatory Visit: Payer: Self-pay

## 2018-09-22 ENCOUNTER — Encounter (HOSPITAL_COMMUNITY): Payer: Self-pay

## 2018-09-22 ENCOUNTER — Emergency Department (HOSPITAL_COMMUNITY): Payer: Medicaid Other

## 2018-09-22 DIAGNOSIS — Y939 Activity, unspecified: Secondary | ICD-10-CM | POA: Diagnosis not present

## 2018-09-22 DIAGNOSIS — Y999 Unspecified external cause status: Secondary | ICD-10-CM | POA: Diagnosis not present

## 2018-09-22 DIAGNOSIS — Y929 Unspecified place or not applicable: Secondary | ICD-10-CM | POA: Diagnosis not present

## 2018-09-22 DIAGNOSIS — S6992XA Unspecified injury of left wrist, hand and finger(s), initial encounter: Secondary | ICD-10-CM | POA: Insufficient documentation

## 2018-09-22 DIAGNOSIS — M546 Pain in thoracic spine: Secondary | ICD-10-CM | POA: Diagnosis not present

## 2018-09-22 DIAGNOSIS — Z87891 Personal history of nicotine dependence: Secondary | ICD-10-CM | POA: Diagnosis not present

## 2018-09-22 MED ORDER — IBUPROFEN 400 MG PO TABS
600.0000 mg | ORAL_TABLET | Freq: Once | ORAL | Status: AC
  Administered 2018-09-22: 600 mg via ORAL
  Filled 2018-09-22: qty 1

## 2018-09-22 NOTE — Discharge Instructions (Signed)
Your x-rays today show no evidence of fracture.  Use the splint provided to help with your wrist, ice and elevate the wrist and take ibuprofen and Tylenol for pain.  If symptoms are not improving please follow-up with your primary care doctor.
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
## 2018-09-22 NOTE — ED Notes (Signed)
Patient transported to X-ray 

## 2018-12-31 ENCOUNTER — Emergency Department (HOSPITAL_COMMUNITY)
Admission: EM | Admit: 2018-12-31 | Discharge: 2018-12-31 | Disposition: A | Discharge status: Home / Self Care | Payer: Medicaid Other | Attending: Emergency Medicine | Admitting: Emergency Medicine

## 2018-12-31 ENCOUNTER — Other Ambulatory Visit: Payer: Self-pay

## 2018-12-31 ENCOUNTER — Emergency Department (HOSPITAL_COMMUNITY): Payer: Medicaid Other

## 2018-12-31 ENCOUNTER — Encounter (HOSPITAL_COMMUNITY): Payer: Self-pay

## 2018-12-31 DIAGNOSIS — R55 Syncope and collapse: Secondary | ICD-10-CM | POA: Insufficient documentation

## 2018-12-31 DIAGNOSIS — R220 Localized swelling, mass and lump, head: Secondary | ICD-10-CM | POA: Diagnosis not present

## 2018-12-31 DIAGNOSIS — Z5321 Procedure and treatment not carried out due to patient leaving prior to being seen by health care provider: Secondary | ICD-10-CM | POA: Insufficient documentation

## 2018-12-31 DIAGNOSIS — R519 Headache, unspecified: Secondary | ICD-10-CM | POA: Diagnosis present

## 2018-12-31 LAB — CBC
HCT: 40.3 % (ref 36.0–46.0)
Hemoglobin: 13.4 g/dL (ref 12.0–15.0)
MCH: 31.6 pg (ref 26.0–34.0)
MCHC: 33.3 g/dL (ref 30.0–36.0)
MCV: 95 fL (ref 80.0–100.0)
Platelets: 374 10*3/uL (ref 150–400)
RBC: 4.24 MIL/uL (ref 3.87–5.11)
RDW: 12.9 % (ref 11.5–15.5)
WBC: 11.1 10*3/uL — ABNORMAL HIGH (ref 4.0–10.5)
nRBC: 0 % (ref 0.0–0.2)

## 2018-12-31 LAB — BASIC METABOLIC PANEL
Anion gap: 12 (ref 5–15)
BUN: 8 mg/dL (ref 6–20)
CO2: 22 mmol/L (ref 22–32)
Calcium: 9.2 mg/dL (ref 8.9–10.3)
Chloride: 102 mmol/L (ref 98–111)
Creatinine, Ser: 0.93 mg/dL (ref 0.44–1.00)
GFR calc Af Amer: >60 mL/min (ref >60)
GFR calc non Af Amer: >60 mL/min (ref >60)
Glucose, Bld: 122 mg/dL — ABNORMAL HIGH (ref 70–99)
Potassium: 3.5 mmol/L (ref 3.5–5.1)
Sodium: 136 mmol/L (ref 135–145)

## 2018-12-31 LAB — I-STAT BETA HCG BLOOD, ED (MC, WL, AP ONLY): I-stat hCG, quantitative: <5 m[IU]/mL (ref <5)

## 2018-12-31 NOTE — ED Notes (Signed)
Pt called for room with no answer. 

## 2018-12-31 NOTE — ED Notes (Signed)
Called pt to recheck vitals. No response.  

## 2018-12-31 NOTE — ED Notes (Signed)
Pt states that she is feeling "like I want to pass out. I am really sick and feel bad" will recheck VS
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
## 2019-06-10 ENCOUNTER — Telehealth (INDEPENDENT_AMBULATORY_CARE_PROVIDER_SITE_OTHER): Payer: Medicaid Other | Admitting: Primary Care

## 2019-06-12 ENCOUNTER — Ambulatory Visit (INDEPENDENT_AMBULATORY_CARE_PROVIDER_SITE_OTHER): Payer: Medicaid Other | Admitting: Primary Care

## 2019-06-27 ENCOUNTER — Other Ambulatory Visit: Payer: Self-pay

## 2019-06-27 ENCOUNTER — Ambulatory Visit (INDEPENDENT_AMBULATORY_CARE_PROVIDER_SITE_OTHER): Payer: Medicaid Other | Admitting: Primary Care

## 2019-06-27 ENCOUNTER — Encounter (INDEPENDENT_AMBULATORY_CARE_PROVIDER_SITE_OTHER): Payer: Self-pay | Admitting: Primary Care

## 2019-06-27 ENCOUNTER — Ambulatory Visit (HOSPITAL_COMMUNITY)
Admission: RE | Admit: 2019-06-27 | Discharge: 2019-06-27 | Disposition: A | Discharge status: Home / Self Care | Payer: Medicaid Other | Source: Ambulatory Visit | Attending: Primary Care | Admitting: Primary Care

## 2019-06-27 VITALS — BP 121/76 | HR 82 | Temp 97.7°F | Ht 64.0 in | Wt 185.2 lb

## 2019-06-27 DIAGNOSIS — Z01818 Encounter for other preprocedural examination: Secondary | ICD-10-CM

## 2019-06-27 DIAGNOSIS — Z720 Tobacco use: Secondary | ICD-10-CM

## 2019-06-27 NOTE — Progress Notes (Signed)
Entry in error

## 2019-06-27 NOTE — Progress Notes (Signed)
Per pt she is getting lipo and need clearance.   185.2
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
## 2019-06-28 LAB — LIPID PANEL
Chol/HDL Ratio: 3.2 ratio (ref 0.0–4.4)
Cholesterol, Total: 167 mg/dL (ref 100–199)
HDL: 52 mg/dL (ref >39)
LDL Chol Calc (NIH): 104 mg/dL — ABNORMAL HIGH (ref 0–99)
Triglycerides: 55 mg/dL (ref 0–149)
VLDL Cholesterol Cal: 11 mg/dL (ref 5–40)

## 2019-06-28 LAB — CMP14+EGFR
ALT: 11 IU/L (ref 0–32)
AST: 18 IU/L (ref 0–40)
Albumin/Globulin Ratio: 2 (ref 1.2–2.2)
Albumin: 4.4 g/dL (ref 3.9–5.0)
Alkaline Phosphatase: 82 IU/L (ref 39–117)
BUN/Creatinine Ratio: 13 (ref 9–23)
BUN: 11 mg/dL (ref 6–20)
Bilirubin Total: 0.4 mg/dL (ref 0.0–1.2)
CO2: 24 mmol/L (ref 20–29)
Calcium: 9.3 mg/dL (ref 8.7–10.2)
Chloride: 104 mmol/L (ref 96–106)
Creatinine, Ser: 0.84 mg/dL (ref 0.57–1.00)
GFR calc Af Amer: 109 mL/min/{1.73_m2} (ref >59)
GFR calc non Af Amer: 95 mL/min/{1.73_m2} (ref >59)
Globulin, Total: 2.2 g/dL (ref 1.5–4.5)
Glucose: 88 mg/dL (ref 65–99)
Potassium: 4.1 mmol/L (ref 3.5–5.2)
Sodium: 140 mmol/L (ref 134–144)
Total Protein: 6.6 g/dL (ref 6.0–8.5)

## 2019-06-28 LAB — CBC WITH DIFFERENTIAL/PLATELET
Basophils Absolute: 0 10*3/uL (ref 0.0–0.2)
Basos: 0 %
EOS (ABSOLUTE): 0 10*3/uL (ref 0.0–0.4)
Eos: 0 %
Hematocrit: 39.9 % (ref 34.0–46.6)
Hemoglobin: 13.3 g/dL (ref 11.1–15.9)
Immature Grans (Abs): 0 10*3/uL (ref 0.0–0.1)
Immature Granulocytes: 0 %
Lymphocytes Absolute: 2.2 10*3/uL (ref 0.7–3.1)
Lymphs: 25 %
MCH: 32 pg (ref 26.6–33.0)
MCHC: 33.3 g/dL (ref 31.5–35.7)
MCV: 96 fL (ref 79–97)
Monocytes Absolute: 0.4 10*3/uL (ref 0.1–0.9)
Monocytes: 5 %
Neutrophils Absolute: 6.3 10*3/uL (ref 1.4–7.0)
Neutrophils: 70 %
Platelets: 335 10*3/uL (ref 150–450)
RBC: 4.16 x10E6/uL (ref 3.77–5.28)
RDW: 13.1 % (ref 11.7–15.4)
WBC: 9 10*3/uL (ref 3.4–10.8)

## 2019-06-30 ENCOUNTER — Telehealth (INDEPENDENT_AMBULATORY_CARE_PROVIDER_SITE_OTHER): Payer: Self-pay

## 2019-06-30 ENCOUNTER — Other Ambulatory Visit (INDEPENDENT_AMBULATORY_CARE_PROVIDER_SITE_OTHER): Payer: Self-pay | Admitting: Primary Care

## 2019-06-30 DIAGNOSIS — R791 Abnormal coagulation profile: Secondary | ICD-10-CM

## 2019-06-30 DIAGNOSIS — Z01818 Encounter for other preprocedural examination: Secondary | ICD-10-CM

## 2019-06-30 LAB — PROTIME-INR
INR: 1 (ref 0.9–1.2)
Prothrombin Time: 10.7 s (ref 9.1–12.0)

## 2019-06-30 LAB — BETA HCG QUANT (REF LAB): hCG Quant: <1 m[IU]/mL

## 2019-06-30 LAB — APTT: aPTT: 25 s (ref 24–33)

## 2019-06-30 NOTE — Telephone Encounter (Signed)
Spoke with patient. She is aware that medical clearance has been completed and faxed. Apologized to patient for inconvenience.

## 2019-06-30 NOTE — Telephone Encounter (Signed)
Patient called in regards to her medical clearance. States PCP advice she would be calling her to let her know the results of her x-ray. Patient also states that her medical clearance forms has to be turn in with a certain time and advice PCP of time frame.  Please advice [phone]

## 2019-07-01 LAB — HIV ANTIBODY (ROUTINE TESTING W REFLEX)
HIV Screen 4th Generation wRfx: NONREACTIVE
HIV Screen 4th Generation wRfx: NONREACTIVE

## 2019-07-01 LAB — SPECIMEN STATUS REPORT

## 2019-07-01 LAB — TSH+FREE T4
Free T4: 1.35 ng/dL (ref 0.82–1.77)
TSH: 1.54 u[IU]/mL (ref 0.450–4.500)

## 2019-07-01 LAB — T3, FREE: T3, Free: 2.9 pg/mL (ref 2.0–4.4)

## 2019-07-01 LAB — HGB A1C W/O EAG: Hgb A1c MFr Bld: 4.9 % (ref 4.8–5.6)

## 2019-09-09 ENCOUNTER — Other Ambulatory Visit: Payer: Self-pay

## 2019-09-09 ENCOUNTER — Ambulatory Visit (INDEPENDENT_AMBULATORY_CARE_PROVIDER_SITE_OTHER): Payer: Medicaid Other | Admitting: Obstetrics

## 2019-09-09 VITALS — BP 127/81 | HR 79 | Wt 180.0 lb

## 2019-09-09 DIAGNOSIS — N6315 Unspecified lump in the right breast, overlapping quadrants: Secondary | ICD-10-CM | POA: Diagnosis not present

## 2019-09-09 NOTE — Progress Notes (Signed)
Pt reports she noticed a lump in her R breast about 3 years ago when she was pregnant with her twins. Pt has noticed that the lump has gotten bigger, she does not report any other symptoms.
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
## 2019-09-22 ENCOUNTER — Other Ambulatory Visit: Payer: Medicaid Other

## 2019-09-29 ENCOUNTER — Encounter: Payer: Self-pay | Admitting: Obstetrics

## 2019-09-29 ENCOUNTER — Telehealth: Payer: Medicaid Other | Admitting: Obstetrics

## 2019-09-29 DIAGNOSIS — N6315 Unspecified lump in the right breast, overlapping quadrants: Secondary | ICD-10-CM

## 2019-09-29 NOTE — Progress Notes (Signed)
I connected with  Kathleen Esparza on 09/29/19 by a video enabled telemedicine application and verified that I am speaking with the correct person using two identifiers.   I discussed the limitations of evaluation and management by telemedicine. The patient expressed understanding and agreed to proceed.  She reports no problems today, stated that the lump in her breast is gone.

## 2019-09-29 NOTE — Progress Notes (Signed)
Patient did not answer call.  Brock Bad, MD 09/29/2019 9:28 AM

## 2020-01-12 ENCOUNTER — Emergency Department (HOSPITAL_COMMUNITY): Payer: Medicaid Other

## 2020-01-12 ENCOUNTER — Emergency Department (HOSPITAL_COMMUNITY)
Admission: EM | Admit: 2020-01-12 | Discharge: 2020-01-12 | Disposition: A | Discharge status: Home / Self Care | Payer: Medicaid Other | Attending: Emergency Medicine | Admitting: Emergency Medicine

## 2020-01-12 ENCOUNTER — Other Ambulatory Visit: Payer: Self-pay

## 2020-01-12 DIAGNOSIS — Z87891 Personal history of nicotine dependence: Secondary | ICD-10-CM | POA: Diagnosis not present

## 2020-01-12 DIAGNOSIS — R6884 Jaw pain: Secondary | ICD-10-CM | POA: Diagnosis not present

## 2020-01-12 DIAGNOSIS — S0993XA Unspecified injury of face, initial encounter: Secondary | ICD-10-CM | POA: Insufficient documentation

## 2020-01-12 MED ORDER — KETOROLAC TROMETHAMINE 60 MG/2ML IM SOLN
30.0000 mg | Freq: Once | INTRAMUSCULAR | Status: AC
  Administered 2020-01-12: 30 mg via INTRAMUSCULAR
  Filled 2020-01-12: qty 2
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
## 2020-01-12 NOTE — Discharge Instructions (Signed)
Thank you for allowing me to care for you today in the Emergency Department.   Your imaging did not show any broken bones/fractures today.  Take 650 mg of Tylenol or 600 mg of ibuprofen with food every 6 hours for pain.  You can alternate between these 2 medications every 3 hours if your pain returns.  For instance, you can take Tylenol at noon, followed by a dose of ibuprofen at 3, followed by second dose of Tylenol and 6.  You can apply an ice pack to areas that are painful or swollen for 15 to 20 minutes up to 3-5 times a day.  Although you did not break any phone stay, I did provide information on a diet that may be easier to eat and swallow until the swelling goes down around her jaw.  This is attached along with your discharge paperwork.  I have also included some other counseling resources on your discharge paperwork.  Return to the emergency department if you become unable to swallow, if you feel unsafe, if you sustain any new injuries, if you become unable to open your mouth, or have other new, concerning symptoms.
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
## 2020-01-12 NOTE — ED Notes (Signed)
Pt able to tolerate small sips of water at this time 

## 2020-02-25 ENCOUNTER — Ambulatory Visit: Payer: Medicaid Other | Admitting: Obstetrics
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
## 2021-03-04 ENCOUNTER — Other Ambulatory Visit: Payer: Self-pay

## 2021-03-04 ENCOUNTER — Emergency Department (HOSPITAL_COMMUNITY)
Admission: EM | Admit: 2021-03-04 | Discharge: 2021-03-05 | Disposition: A | Discharge status: Home / Self Care | Payer: Medicaid Other | Attending: Emergency Medicine | Admitting: Emergency Medicine

## 2021-03-04 ENCOUNTER — Encounter (HOSPITAL_COMMUNITY): Payer: Self-pay | Admitting: Emergency Medicine

## 2021-03-04 ENCOUNTER — Emergency Department (HOSPITAL_COMMUNITY): Payer: Medicaid Other

## 2021-03-04 DIAGNOSIS — S01351A Open bite of right ear, initial encounter: Secondary | ICD-10-CM | POA: Insufficient documentation

## 2021-03-04 DIAGNOSIS — M79642 Pain in left hand: Secondary | ICD-10-CM | POA: Diagnosis present

## 2021-03-04 DIAGNOSIS — W503XXA Accidental bite by another person, initial encounter: Secondary | ICD-10-CM
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
## 2021-03-04 NOTE — Discharge Instructions (Addendum)
I am prescribing you an antibiotic called Augmentin.  Please begin taking this tomorrow morning.  I want you to take this twice a day for 7 days.  Do not stop taking this early.  If you develop worsening pain, swelling, fevers, vomiting, or generally worsening symptoms, please come back to the emergency department for reevaluation.  It was a pleasure to meet you.

## 2021-03-05 MED ORDER — AMOXICILLIN-POT CLAVULANATE 875-125 MG PO TABS: 1.0000 | ORAL_TABLET | Freq: Two times a day (BID) | ORAL | 0 refills | Status: AC
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
## 2021-04-26 IMAGING — CR DG CHEST 2V
2 series · 2 of 2 positions shown · non-contrast
Comparison: None.

CLINICAL DATA: Medical clearance for liposuction

EXAM:
CHEST - 2 VIEW

[w chest pa *]
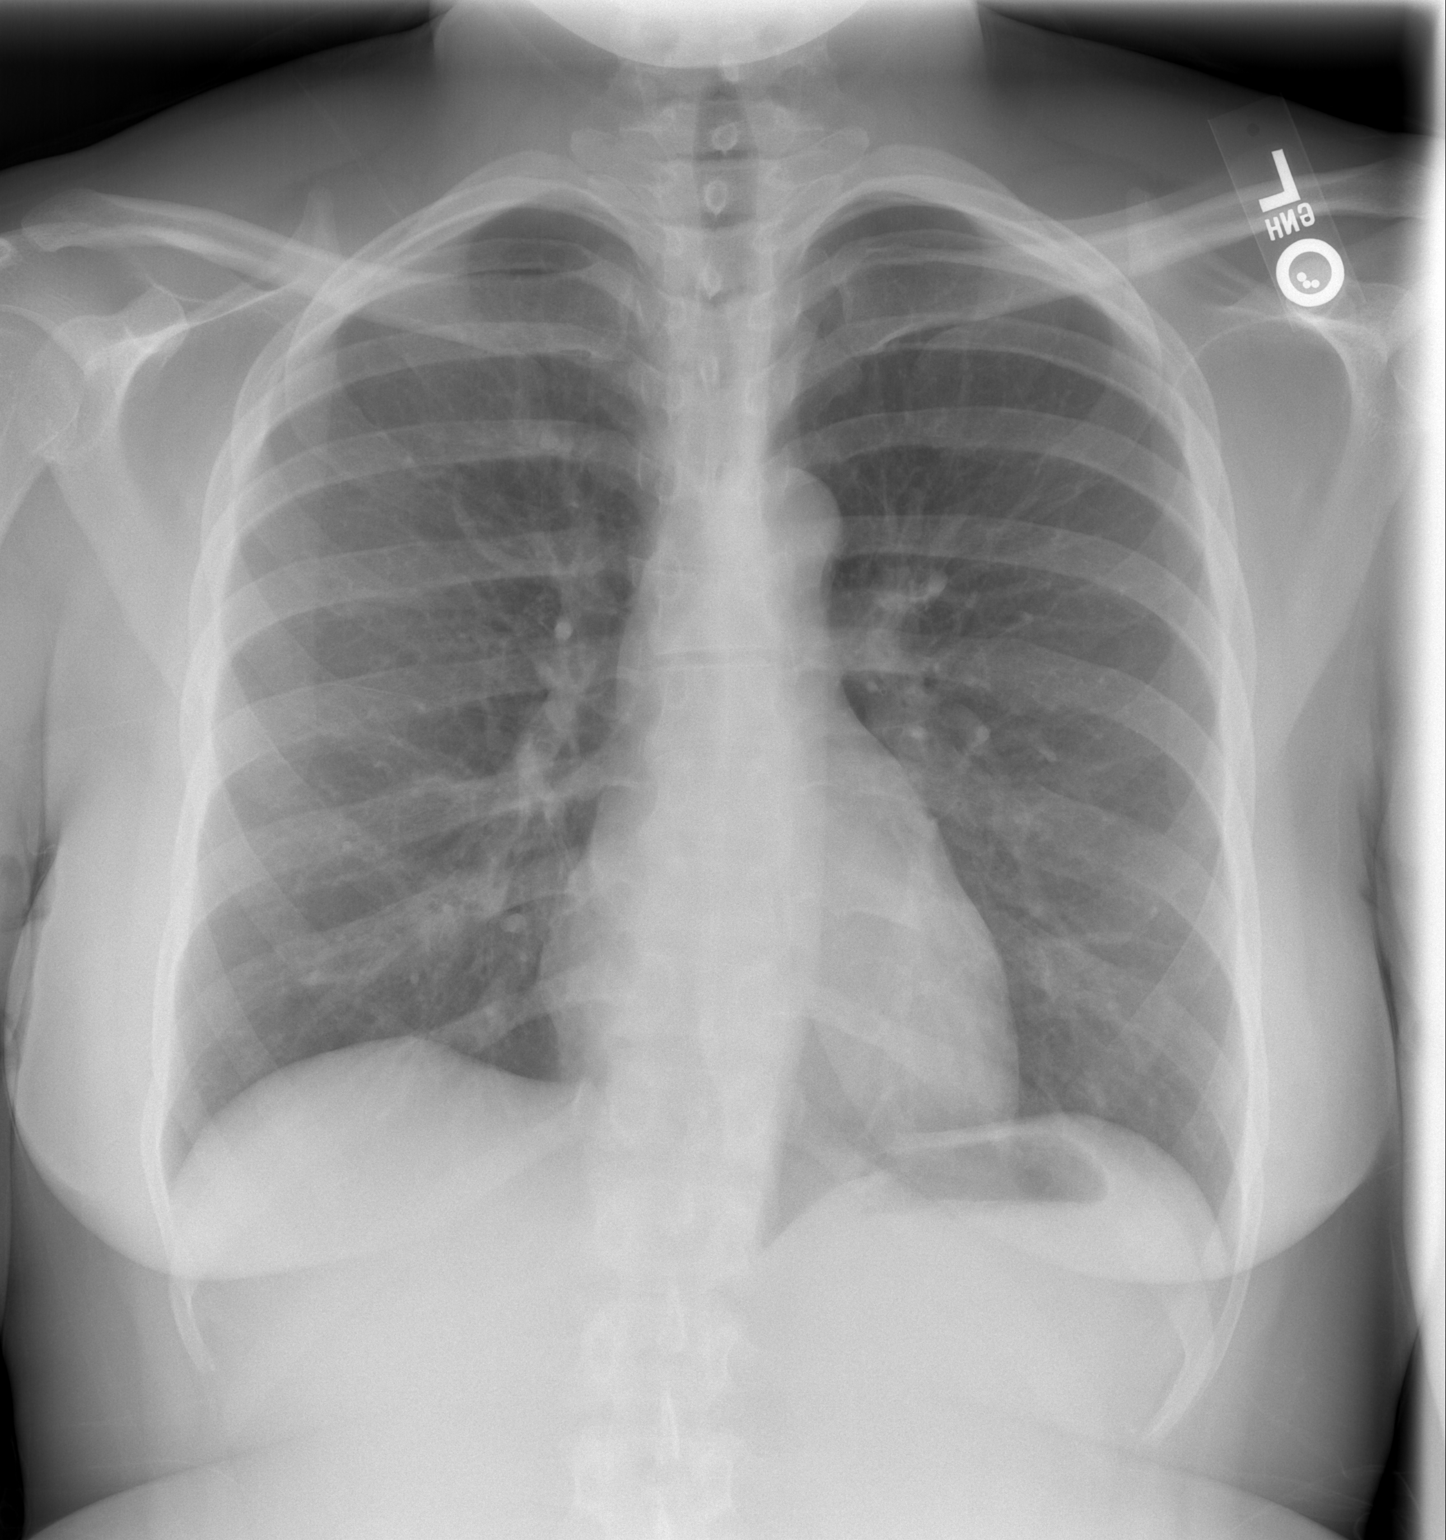

[w chest lat]
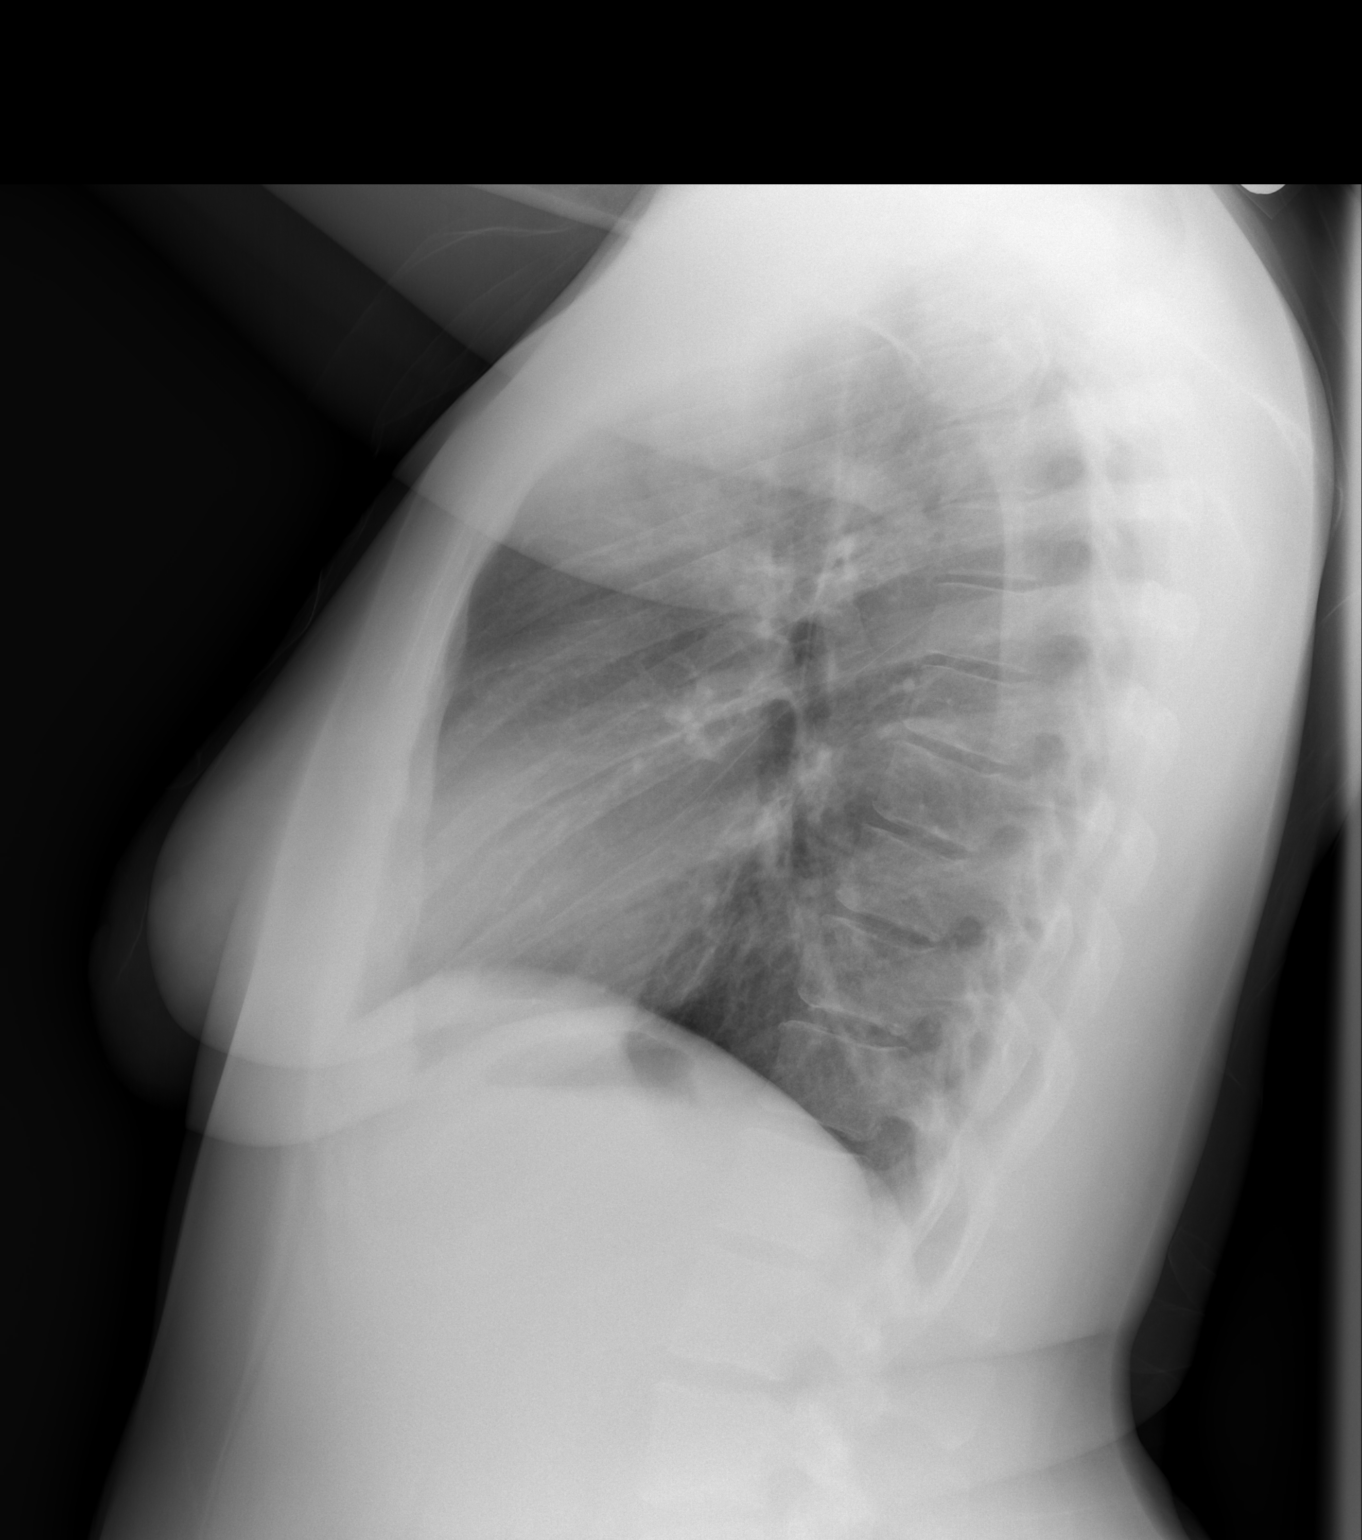

[2 of 2 positions shown; findings below may reference images not displayed]

FINDINGS: No consolidation, features of edema, pneumothorax, or effusion.
Pulmonary vascularity is normally distributed. The cardiomediastinal
contours are unremarkable. No acute osseous or soft tissue
abnormality.
IMPRESSION: No acute cardiopulmonary abnormality.

## 2021-07-19 ENCOUNTER — Ambulatory Visit: Payer: Medicaid Other | Admitting: Obstetrics

## 2021-08-24 ENCOUNTER — Ambulatory Visit: Payer: Medicaid Other | Admitting: Obstetrics

## 2021-09-05 ENCOUNTER — Encounter: Payer: Self-pay | Admitting: Obstetrics and Gynecology

## 2021-09-05 ENCOUNTER — Ambulatory Visit (INDEPENDENT_AMBULATORY_CARE_PROVIDER_SITE_OTHER): Payer: Medicaid Other | Admitting: Obstetrics and Gynecology

## 2021-09-05 ENCOUNTER — Other Ambulatory Visit (HOSPITAL_COMMUNITY)
Admission: RE | Admit: 2021-09-05 | Discharge: 2021-09-05 | Disposition: A | Discharge status: Home / Self Care | Payer: Medicaid Other | Source: Ambulatory Visit | Attending: Obstetrics | Admitting: Obstetrics

## 2021-09-05 ENCOUNTER — Other Ambulatory Visit (HOSPITAL_COMMUNITY)
Admission: RE | Admit: 2021-09-05 | Discharge: 2021-09-05 | Disposition: A | Discharge status: Home / Self Care | Payer: Medicaid Other | Source: Ambulatory Visit | Attending: General Surgery | Admitting: General Surgery

## 2021-09-05 VITALS — BP 140/90 | HR 85 | Ht 64.0 in | Wt 200.7 lb

## 2021-09-05 DIAGNOSIS — Z3046 Encounter for surveillance of implantable subdermal contraceptive: Secondary | ICD-10-CM

## 2021-09-05 DIAGNOSIS — Z01419 Encounter for gynecological examination (general) (routine) without abnormal findings: Secondary | ICD-10-CM | POA: Insufficient documentation

## 2021-09-05 DIAGNOSIS — Z30017 Encounter for initial prescription of implantable subdermal contraceptive: Secondary | ICD-10-CM

## 2021-09-05 MED ORDER — ETONOGESTREL 68 MG ~~LOC~~ IMPL
68.0000 mg | DRUG_IMPLANT | Freq: Once | SUBCUTANEOUS | Status: AC
  Administered 2021-09-05: 68 mg via SUBCUTANEOUS

## 2021-09-05 NOTE — Progress Notes (Signed)
Subjective:     Kathleen Esparza is a 31 y.o. female P3 with BMI 34 is here for a comprehensive physical exam. The patient reports no problems. She reports a monthly period. She is not sexually active but has a Nexplanon in place which is due for removal (device expired 2 years ago) and desires re-insertion. Patient desires STI testing. She reports some occasional pelvic pain. She denies abnormal discharge. Patient denies urinary incontinence or constipation.  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              DC/DA - O30.049  Concordant (<20%), nml AFV, AGA, no other comorbidities  Discordant (> 20%)**       **O30.003 is extra code for discordant growth**   Q 3 wks 16-   Medical history non-contributory    Supervision of other normal pregnancy, antepartum 07/05/2016   [facility] Prenatal Labs Dating U/S Blood type: O/Positive/-- (05/23 1638)  Genetic Screen 1 Screen:    AFP:     Quad:     NIPS: Antibody:Negative (05/23 1638) Anatomic [place] 08-30-16 Rubella: 1.38 (05/23 1638) GTT Early:               Third trimester:  RPR: Non Reactive (09/11 1108)  Flu vaccine declined HBsAg: Negative (05/23 1638)  TDaP vaccine 11/07/16                                          Past Surgical History:  Procedure Laterality Date   INDUCED ABORTION     TONSILLECTOMY  2016   Family History  Problem Relation Age of Onset   COPD Maternal Grandmother    Diabetes Maternal Grandmother    Hypertension Maternal Grandmother    Other Neg Hx      Social History   Socioeconomic History   Marital status: Single    Spouse name: Not on file   Number of children: Not on file   Years of education: Not on file   Highest education level: Not on file   Occupational History   Not on file  Tobacco Use   Smoking status: Former    Types: Cigarettes    Quit date: 2017    Years since quitting: 6.5   Smokeless tobacco: Never  Vaping Use   Vaping Use: Never used  Substance and Sexual Activity   Alcohol use: No    Alcohol/week: 0.0 standard drinks of alcohol   Drug use: No   Sexual activity: Not Currently    Partners: Male    Birth control/protection: None  Other Topics Concern   Not on file  Social History Narrative   Not on file   Social Determinants of Health   Financial Resource Strain: Not on file  Food Insecurity: Not on file  Transportation Needs: Not on file  Physical Activity: Not on file  Stress: Not on file  Social Connections: Not on file  Intimate Partner Violence: Not on file   Health Maintenance  Topic Date Due   COVID-19 Vaccine (1) Never done   PAP  SMEAR-Modifier  07/06/2019   INFLUENZA VACCINE  09/13/2021   TETANUS/TDAP  11/08/2026   Hepatitis C Screening  Completed   HIV Screening  Completed   HPV VACCINES  Aged Out       Review of Systems Pertinent items noted in HPI and remainder of comprehensive ROS otherwise negative.   Objective:    GENERAL: Well-developed, well-nourished female in no acute distress.  HEENT: Normocephalic, atraumatic. Sclerae anicteric.  NECK: Supple. Normal thyroid.  LUNGS: Clear to auscultation bilaterally.  HEART: Regular rate and rhythm. BREASTS: Symmetric in size. No palpable masses or lymphadenopathy, skin changes, or nipple drainage. ABDOMEN: Soft, nontender, nondistended. No organomegaly. PELVIC: Normal external female genitalia. Vagina is pink and rugated.  Normal discharge. Normal appearing cervix. Uterus is normal in size. No adnexal mass or tenderness. Chaperone present during the pelvic exam EXTREMITIES: No cyanosis, clubbing, or edema, 2+ distal pulses.     Assessment:    Healthy female exam.      Plan:    Pap smear collected STI screening per  patient request Health maintenance labs ordered Patient will be contacted with abnormal results  Nexplanon Removal Pregnancy test was negative. Patient given informed consent for removal of her Nexplanon, time out was performed.  Signed copy in the chart.  Appropriate time out taken. Nexplanon site identified.  Area prepped in usual sterile fashon. One cc of 1% lidocaine was used to anesthetize the area at the distal end of the implant. A small stab incision was made right beside the implant on the distal portion.  The Nexplanon rod was grasped using hemostats and removed without difficulty.  There was less than 3 cc blood loss. There were no complications.  Patient was prepped with betadine, Nexplanon removed form packaging.  Device confirmed in needle. Using the same incision site, the new Nexplanon was inserted full length of needle and withdrawn per handbook instructions.  Patient insertion site covered with steri-strips, a band-aid and Coband.   Minimal blood loss.  Patient tolerated the procedure well.   See After Visit Summary for Counseling Recommendations

## 2021-09-05 NOTE — Progress Notes (Signed)
Pt presents for annual exam. Pt is requesting Nexplanon removal and reinsertion. Pt states Nexplanon was placed 2018. Pt requesting STD testing. No other concerns at this time.

## 2021-09-05 NOTE — Addendum Note (Signed)
Addended by: Jearld Adjutant on: 09/05/2021 04:49 PM   Modules accepted: Orders

## 2021-09-06 LAB — HEPB+HEPC+HIV PANEL
HIV Screen 4th Generation wRfx: NONREACTIVE
Hep B C IgM: NEGATIVE
Hep B Core Total Ab: NEGATIVE
Hep B E Ab: NEGATIVE
Hep B E Ag: NEGATIVE
Hep B Surface Ab, Qual: NONREACTIVE
Hep C Virus Ab: NONREACTIVE
Hepatitis B Surface Ag: NEGATIVE

## 2021-09-06 LAB — LIPID PANEL
Chol/HDL Ratio: 3 ratio (ref 0.0–4.4)
Cholesterol, Total: 160 mg/dL (ref 100–199)
HDL: 54 mg/dL (ref >39)
LDL Chol Calc (NIH): 88 mg/dL (ref 0–99)
Triglycerides: 99 mg/dL (ref 0–149)
VLDL Cholesterol Cal: 18 mg/dL (ref 5–40)

## 2021-09-06 LAB — CBC
Hematocrit: 37.9 % (ref 34.0–46.6)
Hemoglobin: 12.4 g/dL (ref 11.1–15.9)
MCH: 30.7 pg (ref 26.6–33.0)
MCHC: 32.7 g/dL (ref 31.5–35.7)
MCV: 94 fL (ref 79–97)
Platelets: 410 10*3/uL (ref 150–450)
RBC: 4.04 x10E6/uL (ref 3.77–5.28)
RDW: 12.7 % (ref 11.7–15.4)
WBC: 11.4 10*3/uL — ABNORMAL HIGH (ref 3.4–10.8)

## 2021-09-06 LAB — COMPREHENSIVE METABOLIC PANEL
ALT: 12 IU/L (ref 0–32)
AST: 16 IU/L (ref 0–40)
Albumin/Globulin Ratio: 1.6 (ref 1.2–2.2)
Albumin: 4.2 g/dL (ref 4.0–5.0)
Alkaline Phosphatase: 98 IU/L (ref 44–121)
BUN/Creatinine Ratio: 8 — ABNORMAL LOW (ref 9–23)
BUN: 8 mg/dL (ref 6–20)
Bilirubin Total: 0.2 mg/dL (ref 0.0–1.2)
CO2: 23 mmol/L (ref 20–29)
Calcium: 9.8 mg/dL (ref 8.7–10.2)
Chloride: 101 mmol/L (ref 96–106)
Creatinine, Ser: 1.06 mg/dL — ABNORMAL HIGH (ref 0.57–1.00)
Globulin, Total: 2.7 g/dL (ref 1.5–4.5)
Glucose: 100 mg/dL — ABNORMAL HIGH (ref 70–99)
Potassium: 3.7 mmol/L (ref 3.5–5.2)
Sodium: 139 mmol/L (ref 134–144)
Total Protein: 6.9 g/dL (ref 6.0–8.5)
eGFR: 72 mL/min/{1.73_m2} (ref >59)

## 2021-09-06 LAB — HEMOGLOBIN A1C
Est. average glucose Bld gHb Est-mCnc: 100 mg/dL
Hgb A1c MFr Bld: 5.1 % (ref 4.8–5.6)

## 2021-09-06 LAB — CERVICOVAGINAL ANCILLARY ONLY
Chlamydia: NEGATIVE
Comment: NEGATIVE
Comment: NORMAL
Neisseria Gonorrhea: NEGATIVE

## 2021-09-06 LAB — RPR: RPR Ser Ql: NONREACTIVE

## 2021-09-06 LAB — TSH: TSH: 1.44 u[IU]/mL (ref 0.450–4.500)

## 2021-09-13 LAB — CYTOLOGY - PAP
Comment: NEGATIVE
Diagnosis: NEGATIVE
High risk HPV: NEGATIVE

## 2022-09-07 ENCOUNTER — Ambulatory Visit: Payer: Medicaid Other | Admitting: Obstetrics and Gynecology

## 2023-03-26 ENCOUNTER — Other Ambulatory Visit (HOSPITAL_COMMUNITY)
Admission: RE | Admit: 2023-03-26 | Discharge: 2023-03-26 | Disposition: A | Discharge status: Home / Self Care | Payer: Medicaid Other | Source: Ambulatory Visit | Attending: Obstetrics and Gynecology | Admitting: Obstetrics and Gynecology

## 2023-03-26 ENCOUNTER — Encounter: Payer: Self-pay | Admitting: Obstetrics and Gynecology

## 2023-03-26 ENCOUNTER — Ambulatory Visit: Payer: Medicaid Other | Admitting: Obstetrics and Gynecology

## 2023-03-26 VITALS — BP 138/85 | HR 96 | Ht 65.0 in | Wt 205.2 lb

## 2023-03-26 DIAGNOSIS — Z01419 Encounter for gynecological examination (general) (routine) without abnormal findings: Secondary | ICD-10-CM | POA: Diagnosis present

## 2023-03-26 NOTE — Progress Notes (Signed)
 Patient presents for Annual.  LMP: 03/12/23 Last pap: Date: 09/05/21-WNL  Contraception: Nexplanon  Mammogram: Not yet indicated STD Screening: requesting STD blood work  Flu Vaccine : N/A  CC: denies any concerns

## 2023-03-26 NOTE — Progress Notes (Signed)
 Subjective:     Kathleen Esparza is a 33 y.o. female G4P3 with amenorrhea secondary to Nexplanon  and BMI 34 who is here for a comprehensive physical exam. The patient reports no problems. Patient is sexually active using Nexplanon  for contraception. She denies pelvic pain or abnormal discharge. She denies urinary incontinence or issues with bowel movement. 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              DC/DA - O30.049  Concordant (<20%), nml AFV, AGA, no other comorbidities  Discordant (> 20%)**       **O30.003 is extra code for discordant growth**   Q 3 wks 16-   Medical history non-contributory    Supervision of other normal pregnancy, antepartum 07/05/2016   .[facility] Prenatal Labs Dating U/S Blood type: O/Positive/-- (05/23 1638)  Genetic Screen 1 Screen:    AFP:     Quad:     NIPS: Antibody:Negative (05/23 1638) Anatomic [place]  08-30-16 Rubella: 1.38 (05/23 1638) GTT Early:               Third trimester:  RPR: Non Reactive (09/11 1108)  Flu vaccine declined HBsAg: Negative (05/23 1638)  TDaP vaccine 11/07/16                                          Past Surgical History:  Procedure Laterality Date   INDUCED ABORTION     TONSILLECTOMY  2016   Family History  Problem Relation Age of Onset   COPD Maternal Grandmother    Diabetes Maternal Grandmother    Hypertension Maternal Grandmother    Other Neg Hx     Social History   Socioeconomic History   Marital status: Single    Spouse name: Not on file   Number of children: Not on file   Years of education: Not on file   Highest education level: Not on file  Occupational History   Not on file  Tobacco Use   Smoking status:  Former    Current packs/day: 0.00    Types: Cigarettes    Quit date: 2017    Years since quitting: 8.1   Smokeless tobacco: Never  Vaping Use   Vaping status: Never Used  Substance and Sexual Activity   Alcohol use: No    Alcohol/week: 0.0 standard drinks of alcohol   Drug use: No   Sexual activity: Not Currently    Partners: Male    Birth control/protection: Implant  Other Topics Concern   Not on file  Social History Narrative   Not on file   Social Drivers of Health   Financial Resource Strain: Not on file  Food Insecurity: Not on file  Transportation Needs: Not on file  Physical Activity: Not on file  Stress: Not on file  Social Connections: Not on file  Intimate Partner Violence: Not on file   Health Maintenance  Topic Date Due   INFLUENZA VACCINE  09/14/2022   COVID-19 Vaccine (1 - 2024-25 season) Never done   Cervical  Cancer Screening (HPV/Pap Cotest)  09/06/2026   DTaP/Tdap/Td (2 - Td or Tdap) 11/08/2026   Hepatitis C Screening  Completed   HIV Screening  Completed   HPV VACCINES  Aged Out       Review of Systems Pertinent items noted in HPI and remainder of comprehensive ROS otherwise negative.   Objective:  Blood pressure 138/85, pulse 96, height 5\' 5"  (1.651 m), weight 205 lb 3.2 oz (93.1 kg), last menstrual period 03/12/2023.   GENERAL: Well-developed, well-nourished female in no acute distress.  HEENT: Normocephalic, atraumatic. Sclerae anicteric.  NECK: Supple. Normal thyroid.  LUNGS: Clear to auscultation bilaterally.  HEART: Regular rate and rhythm. BREASTS: Symmetric in size. No palpable masses or lymphadenopathy, skin changes, or nipple drainage. ABDOMEN: Soft, nontender, nondistended. No organomegaly. PELVIC: Patient declined EXTREMITIES: No cyanosis, clubbing, or edema, 2+ distal pulses.     Assessment:    Healthy female exam.      Plan:    Patient with normal pap smear 08/2021 STI screening per patient request Patient will be  contacted with abnormal results Nexplanon  inserted 08/2021 RTC prn See After Visit Summary for Counseling Recommendations

## 2023-03-27 LAB — CERVICOVAGINAL ANCILLARY ONLY
Chlamydia: NEGATIVE
Comment: NEGATIVE
Comment: NORMAL
Neisseria Gonorrhea: NEGATIVE

## 2023-03-28 LAB — RPR+HBSAG+HCVAB+...
HIV Screen 4th Generation wRfx: NONREACTIVE
Hep C Virus Ab: NONREACTIVE
Hepatitis B Surface Ag: NEGATIVE
RPR Ser Ql: NONREACTIVE

## 2023-11-12 ENCOUNTER — Other Ambulatory Visit (HOSPITAL_COMMUNITY)
Admission: RE | Admit: 2023-11-12 | Discharge: 2023-11-12 | Disposition: A | Discharge status: Home / Self Care | Source: Ambulatory Visit | Attending: Obstetrics and Gynecology | Admitting: Obstetrics and Gynecology

## 2023-11-12 ENCOUNTER — Encounter: Payer: Self-pay | Admitting: Obstetrics and Gynecology

## 2023-11-12 ENCOUNTER — Ambulatory Visit: Admitting: Obstetrics and Gynecology

## 2023-11-12 VITALS — BP 142/91 | HR 74 | Ht 64.0 in | Wt 212.0 lb

## 2023-11-12 DIAGNOSIS — R102 Pelvic and perineal pain: Secondary | ICD-10-CM | POA: Diagnosis not present

## 2023-11-12 DIAGNOSIS — Z113 Encounter for screening for infections with a predominantly sexual mode of transmission: Secondary | ICD-10-CM | POA: Diagnosis present

## 2023-11-12 MED ORDER — HYDROCHLOROTHIAZIDE 25 MG PO TABS: 25.0000 mg | ORAL_TABLET | Freq: Every day | ORAL | 3 refills | Status: AC

## 2023-11-12 NOTE — Progress Notes (Addendum)
 33 y.o. GYN presents for Abdominal pain 8/10 x 2 weeks, STD screening.  Last PAP  08/16/21 NILM  BP's elevated

## 2023-11-12 NOTE — Progress Notes (Addendum)
  33 yo P3 with LMP 11/06/23 here for evaluation of lower abdominal pain. Patient reports onset of pain a week ago around the same time as her menses. Pain has since resolved. Patient is sexually active using Nexplanon  for contraception. Patient reports irregular menses with Nexplanon  and when she has a bleeding episode it typically last 7 days. Patient is without any other complaints. She denies any abnormal discharge. 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              DC/DA - O30.049  Concordant (<20%), nml AFV, AGA, no other comorbidities  Discordant (> 20%)**       **O30.003 is extra code for discordant growth**   Q 3 wks 16-   Medical history non-contributory    Supervision of other normal pregnancy, antepartum 07/05/2016   .[facility] Prenatal Labs Dating U/S Blood type: O/Positive/-- (05/23 1638)  Genetic Screen 1 Screen:    AFP:     Quad:     NIPS: Antibody:Negative (05/23 1638) Anatomic US  08-30-16 Rubella: 1.38 (05/23 1638) GTT Early:               Third trimester:  RPR: Non Reactive (09/11 1108)  Flu vaccine declined HBsAg: Negative (05/23 1638)  TDaP vaccine 11/07/16                                          Past Surgical History:  Procedure Laterality Date   INDUCED ABORTION     TONSILLECTOMY  2016   Family History  Problem Relation Age of Onset   COPD Maternal Grandmother    Diabetes Maternal Grandmother    Hypertension Maternal Grandmother    Other Neg Hx    Social History   Tobacco Use   Smoking status: Some Days    Current packs/day: 0.00    Types: Cigarettes    Last attempt to quit: 2017    Years since quitting: 8.7   Smokeless  tobacco: Never  Vaping Use   Vaping status: Never Used  Substance Use Topics   Alcohol use: No    Alcohol/week: 0.0 standard drinks of alcohol   Drug use: No   ROS See pertinent in HPI. All other systems reviewed and non contributory Blood pressure (!) 142/91, pulse 74, height 5' 4 (1.626 m), weight 212 lb (96.2 kg), last menstrual period 11/06/2023. GENERAL: Well-developed, well-nourished female in no acute distress.  ABDOMEN: Soft, nontender, nondistended. No organomegaly. PELVIC: Normal external female genitalia. Vagina is pink and rugated.  Normal discharge. Normal appearing cervix. Uterus is normal in size. No adnexal mass or tenderness. Chaperone present during the pelvic exam EXTREMITIES: No cyanosis, clubbing, or edema, 2+ distal pulses.  A/P 33 yo with intermittent lower abdominal pain - Reassurance provided - STI screening collected - Patient will be contacted with abnormal results - RTC in July for annual exam with pap smear and Nexplanon  removal - Rx hydrochlorothiazide  provided for HTN management while patient awaits her PCP appointment in October

## 2023-11-13 LAB — HEPATITIS C ANTIBODY: Hep C Virus Ab: NONREACTIVE

## 2023-11-13 LAB — CERVICOVAGINAL ANCILLARY ONLY
Chlamydia: NEGATIVE
Comment: NEGATIVE
Comment: NORMAL
Neisseria Gonorrhea: NEGATIVE

## 2023-11-13 LAB — HIV ANTIBODY (ROUTINE TESTING W REFLEX): HIV Screen 4th Generation wRfx: NONREACTIVE

## 2023-11-13 LAB — RPR: RPR Ser Ql: NONREACTIVE

## 2023-11-13 LAB — HEPATITIS B SURFACE ANTIGEN: Hepatitis B Surface Ag: NEGATIVE

## 2023-12-17 ENCOUNTER — Telehealth: Payer: Self-pay

## 2023-12-17 ENCOUNTER — Ambulatory Visit: Payer: Self-pay

## 2023-12-17 NOTE — Telephone Encounter (Signed)
 Called pt and left vm to call office back to reschedule missed appt
